# Patient Record
Sex: Female | Born: 1995 | Race: Black or African American | Hispanic: No | Marital: Single | Smoking: Never smoker
Health system: Southern US, Community
[De-identification: ages and names within clinical notes are randomized; demographics above are authoritative.]

## PROBLEM LIST (undated history)

## (undated) ENCOUNTER — Inpatient Hospital Stay (HOSPITAL_COMMUNITY): Payer: Self-pay

## (undated) DIAGNOSIS — F32A Depression, unspecified: Secondary | ICD-10-CM

## (undated) DIAGNOSIS — F329 Major depressive disorder, single episode, unspecified: Secondary | ICD-10-CM

## (undated) DIAGNOSIS — Z3490 Encounter for supervision of normal pregnancy, unspecified, unspecified trimester: Secondary | ICD-10-CM

## (undated) DIAGNOSIS — D649 Anemia, unspecified: Secondary | ICD-10-CM

## (undated) HISTORY — DX: Anemia, unspecified: D64.9

## (undated) HISTORY — PX: NO PAST SURGERIES: SHX2092

## (undated) HISTORY — DX: Encounter for supervision of normal pregnancy, unspecified, unspecified trimester: Z34.90

---
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
## 2016-07-06 ENCOUNTER — Ambulatory Visit (INDEPENDENT_AMBULATORY_CARE_PROVIDER_SITE_OTHER): Payer: Medicaid Other | Admitting: Family Medicine

## 2016-07-06 ENCOUNTER — Other Ambulatory Visit (HOSPITAL_COMMUNITY)
Admission: RE | Admit: 2016-07-06 | Discharge: 2016-07-06 | Disposition: A | Discharge status: Home / Self Care | Payer: Medicaid Other | Source: Ambulatory Visit | Attending: Family Medicine | Admitting: Family Medicine

## 2016-07-06 ENCOUNTER — Telehealth: Payer: Self-pay | Admitting: Family Medicine

## 2016-07-06 ENCOUNTER — Encounter: Payer: Self-pay | Admitting: Family Medicine

## 2016-07-06 VITALS — BP 114/76 | HR 102 | Temp 98.7°F | Ht 63.0 in | Wt 149.2 lb

## 2016-07-06 DIAGNOSIS — R300 Dysuria: Secondary | ICD-10-CM | POA: Insufficient documentation

## 2016-07-06 DIAGNOSIS — Z113 Encounter for screening for infections with a predominantly sexual mode of transmission: Secondary | ICD-10-CM

## 2016-07-06 DIAGNOSIS — N898 Other specified noninflammatory disorders of vagina: Secondary | ICD-10-CM | POA: Insufficient documentation

## 2016-07-06 LAB — POCT URINALYSIS DIPSTICK
Bilirubin, UA: NEGATIVE
Glucose, UA: NEGATIVE
KETONES UA: NEGATIVE
Nitrite, UA: NEGATIVE
SPEC GRAV UA: 1.025
Urobilinogen, UA: 4
pH, UA: 7

## 2016-07-06 LAB — POCT WET PREP (WET MOUNT)
CLUE CELLS WET PREP WHIFF POC: NEGATIVE
TRICHOMONAS WET PREP HPF POC: ABSENT

## 2016-07-06 MED ORDER — METRONIDAZOLE 500 MG PO TABS: 500.0000 mg | ORAL_TABLET | Freq: Two times a day (BID) | ORAL | 0 refills | Status: AC

## 2016-07-06 MED ORDER — CEPHALEXIN 500 MG PO CAPS: 500.0000 mg | ORAL_CAPSULE | Freq: Two times a day (BID) | ORAL | 0 refills | Status: AC

## 2016-07-06 NOTE — Patient Instructions (Signed)
It was great seeing you today! We have addressed the following issues today  1. You were prescribed Keflex antibiotic please take 1 tab in the morning and one tab in the evening for 7 days. If symptoms do not improve or you develop fever, back pain, chills please come back to clinic.   If we did any lab work today, and the results require attention, either me or my nurse will get in touch with you. If everything is normal, you will get a letter in mail. If you don't hear from us in two weeks, please give us a call. Otherwise, I look forward to talking with you again at our next visit. If you have any questions or concerns before then, please call the clinic at [phone].  Please bring all your medications to every doctors visit   Sign up for My Chart to have easy access to your labs results, and communication with your Primary care physician.    Please check-out at the front desk before leaving the clinic.   Take Care,

## 2016-07-06 NOTE — Addendum Note (Signed)
Addended by: Lovena NeighboursIALLO, Pattye Meda F on: 07/06/2016 05:43 PM   Modules accepted: Orders

## 2016-07-06 NOTE — Telephone Encounter (Signed)
Called patient an informed her of her wet prep results and new diagnosis of bacterial vaginosis. Patient was told to pick up her metronidazole prescription at her pharmacy and take two take every day for 7 days

## 2016-07-06 NOTE — Assessment & Plan Note (Addendum)
  Patient with dysuria and increase frequency for the past few days consistent with UTI symptoms. WBC and hematuria noted on urine dipstick. --Wet prep --Urine dipstick and culture --GC/Chlamydia testing --Start patient on Keflex 500mg  BID for 7 days

## 2016-07-06 NOTE — Assessment & Plan Note (Signed)
Patient with vaginal discharge for the past two days. Wet prep show numerous clue cells, positive whiff consistent with bacterial vaginosis. --Start on Metronidazole 500 mg BID for 7 days

## 2016-07-06 NOTE — Progress Notes (Signed)
   Subjective:    Patient ID: Linda Keith, female    DOB: 07/08/1996, [age] y.o.   MRN: [id number]   CC: Dysuria and hematuria  HPI: Patient is 20 yo female with a past medical history significant for chlamydia infection who presented today with dysuria and increase frequency. Patient reports that she has been having burning with urination with increase frequency  For the past week. She also has noticed some vaginal discharge for the past two days. Patient has recently resume sexually activity, but states that they have been using condoms. Patient had Nexplanon placed a few months ago and has been doing well with it. Patient denies fever, chills, nausea, vomiting abdominal pain and back pain.   Smoking status reviewed   ROS: all other systems were reviewed and are negative other than in the HPI   Past medical history, surgical, family, and social history reviewed and updated in the EMR as appropriate.  Objective:  BP 114/76 (BP Location: Left Arm, Patient Position: Sitting, Cuff Size: Normal)   Pulse (!) 102   Temp 98.7 F (37.1 C) (Oral)   Ht 5\' 3"  (1.6 m)   Wt 67.7 kg (149 lb 3.2 oz)   SpO2 100%   BMI 26.43 kg/m   Physical Exam  Constitutional: She is oriented to person, place, and time. She appears well-developed and well-nourished.  HENT:  Head: Normocephalic and atraumatic.  Nose: Nose normal.  Eyes: EOM are normal. Pupils are equal, round, and reactive to light.  Neck: Normal range of motion. Neck supple.  Cardiovascular: Normal rate, regular rhythm and normal heart sounds.   Pulmonary/Chest: Effort normal and breath sounds normal.  Abdominal: Soft. Bowel sounds are normal.  Genitourinary: Vaginal discharge found.  Musculoskeletal: Normal range of motion.  Neurological: She is alert and oriented to person, place, and time.  Skin: Skin is warm and dry.  Psychiatric: She has a normal mood and affect. Her behavior is normal. Judgment and thought content normal.    Assessment & Plan:    Dysuria  Patient with dysuria and increase frequency for the past few days consistent with UTI symptoms. WBC and hematuria noted on urine dipstick. --Wet prep --Urine dipstick and culture --GC/Chlamydia testing --Start patient on Keflex 500mg  BID for 7 days   Vaginal discharge Patient with vaginal discharge for the past two days. Wet prep show numerous clue cells, positive whiff consistent with bacterial vaginosis. --Start on Metronidazole 500 mg BID for 7 days  Lovena NeighboursAbdoulaye Paytin Ramakrishnan, MD Family Medicine Resident PGY-1

## 2016-07-08 ENCOUNTER — Telehealth: Payer: Self-pay | Admitting: Pediatrics

## 2016-07-08 LAB — URINE CULTURE: Organism ID, Bacteria: 10000

## 2016-07-08 LAB — GC/CHLAMYDIA PROBE AMP (~~LOC~~) NOT AT ARMC
Chlamydia: POSITIVE — AB
Neisseria Gonorrhea: POSITIVE — AB

## 2016-07-08 NOTE — Telephone Encounter (Signed)
Results for GC/Chlamydia was positive, called patient to inform her of results. I was unable to talk to patient. Will attempt to talk to her again tomorrow.

## 2016-07-09 ENCOUNTER — Telehealth: Payer: Self-pay | Admitting: *Deleted

## 2016-07-09 MED ORDER — CEFIXIME 400 MG PO TABS: 400.0000 mg | ORAL_TABLET | Freq: Every day | ORAL | 0 refills | Status: DC

## 2016-07-09 MED ORDER — AZITHROMYCIN 1 G PO PACK: 1.0000 g | PACK | Freq: Once | ORAL | 0 refills | Status: AC

## 2016-07-09 NOTE — Telephone Encounter (Signed)
Per Dr. Sydnee Cabaliallo: GC/Chlamydia is positive. I just call her but was not able to reach her. Could you try to call her tomorrow and tell her that her results were positive and that she need to come back to clinic for treatment. Will probably need a IM shot of ceftriaxone. Her partner will also need to be treated.    LM for patient to call back to clinic.  Checked with MD to see if patient needs both oral and injectable treatment.  Sami Roes,CMA

## 2016-07-09 NOTE — Addendum Note (Signed)
Addended by: Lovena NeighboursIALLO, Hafsa Lohn F on: 07/09/2016 02:09 PM   Modules accepted: Orders

## 2016-07-09 NOTE — Telephone Encounter (Signed)
Patient is aware of results and would like medication sent to the pharmacy since she is unable to get off of work anymore this week.  She is also aware that her partner will need to be treated.  Will forward to MD to send this and pharmacy was verified with her.

## 2016-07-09 NOTE — Telephone Encounter (Signed)
Per Dr. Sydnee Cabaliallo:  I sent 1 dose of PO cefixime 400 mg and a single dose of azithromycin 1 g PO to her pharmacy she can go get that anytime. She can stop taking the metronidazole, she actually does not have Bacterial Vaginosis (BV) but should finish the keflex course for her UTI.   Patient is aware of this. Linda Keith,CMA

## 2017-04-11 ENCOUNTER — Ambulatory Visit (HOSPITAL_COMMUNITY)
Admission: EM | Admit: 2017-04-11 | Discharge: 2017-04-11 | Disposition: A | Discharge status: Home / Self Care | Payer: Medicaid Other | Attending: Internal Medicine | Admitting: Internal Medicine

## 2017-04-11 ENCOUNTER — Encounter (HOSPITAL_COMMUNITY): Payer: Self-pay | Admitting: *Deleted

## 2017-04-11 DIAGNOSIS — S161XXA Strain of muscle, fascia and tendon at neck level, initial encounter: Secondary | ICD-10-CM | POA: Diagnosis not present

## 2017-04-11 DIAGNOSIS — S39012A Strain of muscle, fascia and tendon of lower back, initial encounter: Secondary | ICD-10-CM

## 2017-04-11 MED ORDER — NAPROXEN 500 MG PO TABS: 500.0000 mg | ORAL_TABLET | Freq: Two times a day (BID) | ORAL | 0 refills | Status: DC

## 2017-04-11 MED ORDER — CYCLOBENZAPRINE HCL 10 MG PO TABS: 10.0000 mg | ORAL_TABLET | Freq: Two times a day (BID) | ORAL | 0 refills | Status: DC | PRN
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
## 2017-07-06 ENCOUNTER — Ambulatory Visit: Payer: Self-pay | Admitting: Family Medicine

## 2017-08-24 ENCOUNTER — Encounter: Payer: Medicaid Other | Admitting: Family Medicine

## 2017-09-29 ENCOUNTER — Encounter: Payer: Self-pay | Admitting: Family Medicine

## 2017-09-29 ENCOUNTER — Other Ambulatory Visit (HOSPITAL_COMMUNITY)
Admission: RE | Admit: 2017-09-29 | Discharge: 2017-09-29 | Disposition: A | Discharge status: Home / Self Care | Payer: Medicaid Other | Source: Ambulatory Visit | Attending: Family Medicine | Admitting: Family Medicine

## 2017-09-29 ENCOUNTER — Ambulatory Visit (INDEPENDENT_AMBULATORY_CARE_PROVIDER_SITE_OTHER): Payer: Medicaid Other | Admitting: Family Medicine

## 2017-09-29 ENCOUNTER — Encounter (INDEPENDENT_AMBULATORY_CARE_PROVIDER_SITE_OTHER): Payer: Self-pay

## 2017-09-29 ENCOUNTER — Other Ambulatory Visit: Payer: Self-pay

## 2017-09-29 VITALS — BP 100/64 | HR 93 | Temp 98.6°F | Wt 164.0 lb

## 2017-09-29 DIAGNOSIS — I422 Other hypertrophic cardiomyopathy: Secondary | ICD-10-CM | POA: Diagnosis not present

## 2017-09-29 DIAGNOSIS — Z113 Encounter for screening for infections with a predominantly sexual mode of transmission: Secondary | ICD-10-CM | POA: Insufficient documentation

## 2017-09-29 DIAGNOSIS — Z0000 Encounter for general adult medical examination without abnormal findings: Secondary | ICD-10-CM | POA: Insufficient documentation

## 2017-09-29 NOTE — Progress Notes (Signed)
   Subjective:    Patient ID: Linda Keith, female    DOB: 17-Apr-1996, 21 y.o.   MRN: [id number]   CC: Annual Physical   HPI: Patient is a 21 yo female with no significant past medical history who present today for her annual physical. Patient has no acute complaints at today visit. Patient report mother was recently diagnosed with hypertrophic cardiomyopathy and was told she needed to be screened. Patient denies any chest pain, shortness of breath, dizziness, poor exercise tolerance.   Smoking status reviewed   ROS: all other systems were reviewed and are negative other than in the HPI   History reviewed. No pertinent past medical history.  Past Surgical History:  Procedure Laterality Date  . CESAREAN SECTION N/A 11/14/2014   Procedure: CESAREAN SECTION;  Surgeon: Catalina AntiguaPeggy Constant, MD;  Location: [facility];  Service: Obstetrics;  Laterality: N/A;  . NO PAST SURGERIES      Past medical history, surgical, family, and social history reviewed and updated in the EMR as appropriate.  Objective:  BP 100/64   Pulse 93   Temp 98.6 F (37 C) (Oral)   Wt 164 lb (74.4 kg)   SpO2 99%   BMI 29.05 kg/m   Vitals and nursing note reviewed  General: NAD, pleasant, able to participate in exam Cardiac: RRR, normal heart sounds, no murmurs. 2+ radial and PT pulses bilaterally Respiratory: CTAB, normal effort, No wheezes, rales or rhonchi Abdomen: soft, nontender, nondistended, no hepatic or splenomegaly, +BS Extremities: no edema or cyanosis. WWP. Skin: warm and dry, no rashes noted Neuro: alert and oriented x4, no focal deficits Psych: Normal affect and mood   Assessment & Plan:   #Annual physical Patient physical exam was within normal limits. Patient will be referred to cardiology for HCM screening given first relative diagnosis (mother). Will screen for hyperlipidemia and will also order CBC and BMP. Patient will also have STD screening today.   Linda NeighboursAbdoulaye Lainie Daubert, MD [facility]  Family Medicine PGY-2

## 2017-09-29 NOTE — Patient Instructions (Signed)
It was great seeing you today! We have addressed the following issues today  1. I will refer you to cardiology for follow up on cardiomyopathy 2. I ordered a lipid panel, a CBC and a BMP. Will follow up with you on the lab results if they are abnormal.  If we did any lab work today, and the results require attention, either me or my nurse will get in touch with you. If everything is normal, you will get a letter in mail and a message via . If you don't hear from us in two weeks, please give us a call. Otherwise, we look forward to seeing you again at your next visit. If you have any questions or concerns before then, please call the clinic at [phone].  Please bring all your medications to every doctors visit  Sign up for My Chart to have easy access to your labs results, and communication with your Primary care physician. Please ask Front Desk for some assistance.   Please check-out at the front desk before leaving the clinic.    Take Care,   Dr. Sydnee Cabaliallo

## 2017-09-30 ENCOUNTER — Telehealth: Payer: Self-pay

## 2017-09-30 ENCOUNTER — Encounter: Payer: Self-pay | Admitting: Family Medicine

## 2017-09-30 ENCOUNTER — Other Ambulatory Visit: Payer: Self-pay | Admitting: Family Medicine

## 2017-09-30 LAB — CBC WITH DIFFERENTIAL/PLATELET
BASOS ABS: 0 10*3/uL (ref 0.0–0.2)
Basos: 0 %
EOS (ABSOLUTE): 0.1 10*3/uL (ref 0.0–0.4)
Eos: 1 %
HEMOGLOBIN: 11.9 g/dL (ref 11.1–15.9)
Hematocrit: 38.8 % (ref 34.0–46.6)
IMMATURE GRANS (ABS): 0 10*3/uL (ref 0.0–0.1)
IMMATURE GRANULOCYTES: 0 %
Lymphocytes Absolute: 1.1 10*3/uL (ref 0.7–3.1)
Lymphs: 20 %
MCH: 22.5 pg — ABNORMAL LOW (ref 26.6–33.0)
MCHC: 30.7 g/dL — ABNORMAL LOW (ref 31.5–35.7)
MCV: 73 fL — ABNORMAL LOW (ref 79–97)
Monocytes Absolute: 0.4 10*3/uL (ref 0.1–0.9)
Monocytes: 8 %
NEUTROS ABS: 3.6 10*3/uL (ref 1.4–7.0)
Neutrophils: 71 %
PLATELETS: 267 10*3/uL (ref 150–379)
RBC: 5.29 x10E6/uL — ABNORMAL HIGH (ref 3.77–5.28)
RDW: 15.4 % (ref 12.3–15.4)
WBC: 5.2 10*3/uL (ref 3.4–10.8)

## 2017-09-30 LAB — BASIC METABOLIC PANEL
BUN / CREAT RATIO: 16 (ref 9–23)
BUN: 13 mg/dL (ref 6–20)
CO2: 20 mmol/L (ref 20–29)
CREATININE: 0.81 mg/dL (ref 0.57–1.00)
Calcium: 9.3 mg/dL (ref 8.7–10.2)
Chloride: 105 mmol/L (ref 96–106)
GFR calc Af Amer: 121 mL/min/{1.73_m2} (ref >59)
GFR calc non Af Amer: 105 mL/min/{1.73_m2} (ref >59)
GLUCOSE: 89 mg/dL (ref 65–99)
POTASSIUM: 4.6 mmol/L (ref 3.5–5.2)
SODIUM: 141 mmol/L (ref 134–144)

## 2017-09-30 LAB — LIPID PANEL
Chol/HDL Ratio: 3.5 ratio (ref 0.0–4.4)
Cholesterol, Total: 152 mg/dL (ref 100–199)
HDL: 43 mg/dL (ref >39)
LDL CALC: 97 mg/dL (ref 0–99)
Triglycerides: 60 mg/dL (ref 0–149)
VLDL CHOLESTEROL CAL: 12 mg/dL (ref 5–40)

## 2017-09-30 LAB — URINE CYTOLOGY ANCILLARY ONLY
Chlamydia: POSITIVE — AB
Neisseria Gonorrhea: NEGATIVE
TRICH (WINDOWPATH): NEGATIVE

## 2017-09-30 MED ORDER — AZITHROMYCIN 250 MG PO TABS: 1000.0000 mg | ORAL_TABLET | Freq: Once | ORAL | 0 refills | Status: AC

## 2017-09-30 NOTE — Telephone Encounter (Signed)
Pt tested positive for Chlamydia, 1gram of Zithromax has been sent to her pharmacy, per pcp. Pt has been contacted and informed of above. Safe sex briefly discussed, no sex for 7 days, please inform partner. Call back number given if any further questions.

## 2017-11-02 ENCOUNTER — Encounter: Payer: Self-pay | Admitting: Family Medicine

## 2017-11-23 ENCOUNTER — Ambulatory Visit: Payer: Medicaid Other | Admitting: Cardiology

## 2017-12-21 ENCOUNTER — Encounter: Payer: Self-pay | Admitting: Family Medicine

## 2017-12-24 ENCOUNTER — Other Ambulatory Visit: Payer: Self-pay

## 2017-12-24 ENCOUNTER — Other Ambulatory Visit (HOSPITAL_COMMUNITY)
Admission: RE | Admit: 2017-12-24 | Discharge: 2017-12-24 | Disposition: A | Discharge status: Home / Self Care | Payer: Medicaid Other | Source: Ambulatory Visit | Attending: Family Medicine | Admitting: Family Medicine

## 2017-12-24 ENCOUNTER — Ambulatory Visit (INDEPENDENT_AMBULATORY_CARE_PROVIDER_SITE_OTHER): Payer: Medicaid Other | Admitting: Internal Medicine

## 2017-12-24 ENCOUNTER — Encounter: Payer: Self-pay | Admitting: Internal Medicine

## 2017-12-24 VITALS — BP 100/70 | HR 86 | Temp 99.4°F | Wt 164.0 lb

## 2017-12-24 DIAGNOSIS — G8929 Other chronic pain: Secondary | ICD-10-CM

## 2017-12-24 DIAGNOSIS — Z8619 Personal history of other infectious and parasitic diseases: Secondary | ICD-10-CM | POA: Insufficient documentation

## 2017-12-24 DIAGNOSIS — M25562 Pain in left knee: Secondary | ICD-10-CM

## 2017-12-24 LAB — POCT URINE PREGNANCY: Preg Test, Ur: NEGATIVE

## 2017-12-24 NOTE — Assessment & Plan Note (Signed)
Tested positive for chlamydia in 09/2017. Wants to have TOC done. - Urine chlamydia ordered, as patient declined pelvic exam - Urine pregnancy test ordered per patient request - Follow-up next month for Nexplanon removal. Patient interested in OCPs.

## 2017-12-24 NOTE — Patient Instructions (Signed)
It was so nice to see you!  I have tested your urine and will call you with your results.  For your knee pain- I think you have something called patellofemoral pain syndrome. Please do the exercises I have given you once a day for 4 weeks. You can also wear a compression sleeve at work and take Ibuprofen as needed.  -Dr. Nancy MarusMayo

## 2017-12-24 NOTE — Assessment & Plan Note (Signed)
Likely patellofemoral pain syndrome with some IT band tightness. No ligamentous instability. No signs of septic arthritis. - Treat conservatively with home rehabilitation exercises- handout given - Recommend wearing compression sleeve while at work - Ice and Ibuprofen as needed - Follow-up with PCP in 4 weeks if no improvement.

## 2017-12-24 NOTE — Progress Notes (Signed)
   [facility] Phone: [phone]  Subjective:  Linda Keith is a 22 year old female presenting to clinic for STD testing and left knee pain.  STD testing: Tested positive for chlamydia 09/29/17. Was treated with Azithromycin PO x 1. She was sexually active with one female partner at the time. That relationship has ended, so she wants to make sure that she does not still have chlamydia. She states that her previous female partner was supposed to have been treated with chlamydia, but she has no way of knowing if this ever actually happened. They were not using condoms. She currently has a nexplanon in place for birth control.  Left Knee Pain: Has been going on for the last year. Pain is located in the anterior knee. She also notes swelling, but denies redness or warmth. The pain and swelling are worse when she is on her feet a lot at work. The pain is better with rest. The pain feels like a "throbbing". She denies any popping, clicking, or locking. She has not tried taking anything for the pain. No trauma or injury to the knee.  ROS: See HPI for pertinent positives and negatives  Past Medical History- recent hx of chlamydia  Family history reviewed for today's visit. No changes.  Social history- patient is a never smoker  Objective: BP 100/70   Pulse 86   Temp 99.4 F (37.4 C) (Oral)   Wt 164 lb (74.4 kg)   SpO2 99%   BMI 29.05 kg/m  Gen: NAD, alert, cooperative with exam Left Knee: No erythema, edema, or gross deformity. Normal ROM. No crepitus. +mild tenderness to palpation over the distal portion of the IT band. +patellar apprehension. -anterior drawer test, -McMurray's, valgus and varus stress tests are negative.  Assessment/Plan: Hx of STD: Tested positive for chlamydia in 09/2017. Wants to have TOC done. - Urine chlamydia ordered, as patient declined pelvic exam - Urine pregnancy test ordered per patient request - Follow-up next month for Nexplanon removal.  Patient interested in OCPs.  Left Knee Pain: Likely patellofemoral pain syndrome with some IT band tightness. No ligamentous instability. No signs of septic arthritis. - Treat conservatively with home rehabilitation exercises- handout given - Recommend wearing compression sleeve while at work - Ice and Ibuprofen as needed - Follow-up with PCP in 4 weeks if no improvement.   Willadean CarolKaty Alroy Portela, MD PGY-3

## 2017-12-27 LAB — URINE CYTOLOGY ANCILLARY ONLY
Chlamydia: NEGATIVE
NEISSERIA GONORRHEA: NEGATIVE
TRICH (WINDOWPATH): NEGATIVE

## 2017-12-29 ENCOUNTER — Telehealth: Payer: Self-pay | Admitting: Internal Medicine

## 2017-12-29 NOTE — Telephone Encounter (Signed)
Called patient to let her know that her STD testing was negative. She will call us back with any additional questions.  Willadean CarolKaty Zander Ingham, MD PGY-3

## 2017-12-30 ENCOUNTER — Encounter: Payer: Self-pay | Admitting: Internal Medicine

## 2018-01-03 ENCOUNTER — Ambulatory Visit (INDEPENDENT_AMBULATORY_CARE_PROVIDER_SITE_OTHER): Payer: Medicaid Other | Admitting: Cardiology

## 2018-01-03 ENCOUNTER — Encounter: Payer: Self-pay | Admitting: Cardiology

## 2018-01-03 VITALS — BP 120/76 | HR 85 | Ht 64.0 in | Wt 168.8 lb

## 2018-01-03 DIAGNOSIS — R0789 Other chest pain: Secondary | ICD-10-CM

## 2018-01-03 DIAGNOSIS — Z8249 Family history of ischemic heart disease and other diseases of the circulatory system: Secondary | ICD-10-CM | POA: Diagnosis not present

## 2018-01-03 NOTE — Progress Notes (Signed)
Cardiology Office Note:    Date:  01/03/2018   ID:  Linda Keith, DOB 02-Aug-1996, MRN [id number]  PCP:  Lovena Neighbours, MD  Cardiologist:  No primary care provider on file.   Referring MD: Lovena Neighbours, MD     History of Present Illness:    Linda Keith is a 22 y.o. 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  Her mother was diagnosed with hypertrophic cardiomyopathy after suffering sudden cardiac death at their house.  She was out for about 15 minutes.  Thankfully her uncle was available and called EMS.  They were able to cardiovert her.  No evidence of heart attack.  She overall has been feeling quite well.  Occasionally she will feel some atypical chest discomfort after eating [employer] which is likely GERD related.  She is not had any unexplained syncope.  No palpitations.  No significant shortness of breath.  She works in Warden/ranger at [employer].  Mother diasgnosed 8. Would not wake up, mouth twisted. EMS, no pulse, CPR shocks. 15 min. No MI in the hospital. Uncle was there.   Grandmother has pacer.   History reviewed. No pertinent past medical history.  Past Surgical History:  Procedure Laterality Date  . CESAREAN SECTION N/A 11/14/2014   Procedure: CESAREAN SECTION;  Surgeon: Catalina Antigua, MD;  Location: [facility];  Service: Obstetrics;  Laterality: N/A;  . NO PAST SURGERIES      Current Medications: No outpatient medications have been marked as taking for the 01/03/18 encounter (Office Visit) with Jake Bathe, MD.     Allergies:   Patient has no known allergies.   Social History   Socioeconomic History  . Marital status: Single    Spouse name: None  . Number of children: None  . Years of education: None  . Highest education level: None  Social Needs  . Financial resource strain: None  . Food insecurity - worry: None  . Food insecurity - inability: None  . Transportation needs - medical: None  .  Transportation needs - non-medical: None  Occupational History  . None  Tobacco Use  . Smoking status: Never Smoker  . Smokeless tobacco: Never Used  Substance and Sexual Activity  . Alcohol use: No    Alcohol/week: 0.0 oz  . Drug use: No  . Sexual activity: Yes    Birth control/protection: None  Other Topics Concern  . None  Social History Narrative  . None     Family History: The patient's has a family history of hypertrophic cardiomyopathy  ROS:   Please see the history of present illness.     All other systems reviewed and are negative.  EKGs/Labs/Other Studies Reviewed:    The following studies were reviewed today: Prior office notes reviewed.  Lab work reviewed, hemoglobin 11.9, LDL 97, creatinine 0.8  EKG:  EKG is  ordered today.  The ekg ordered today demonstrates 01/03/18-sinus rhythm 88 with no other abnormalities.  Normal intervals.  Personally viewed.  Recent Labs: 09/29/2017: BUN 13; Creatinine, Ser 0.81; Hemoglobin 11.9; Platelets 267; Potassium 4.6; Sodium 141  Recent Lipid Panel    Component Value Date/Time   CHOL 152 09/29/2017 1442   TRIG 60 09/29/2017 1442   HDL 43 09/29/2017 1442   CHOLHDL 3.5 09/29/2017 1442   LDLCALC 97 09/29/2017 1442    Physical Exam:    VS:  BP 120/76   Pulse 85   Ht 5\' 4"  (1.626 m)   Wt 168 lb 12.8  oz (76.6 kg)   BMI 28.97 kg/m     Wt Readings from Last 3 Encounters:  01/03/18 168 lb 12.8 oz (76.6 kg)  12/24/17 164 lb (74.4 kg)  09/29/17 164 lb (74.4 kg)     GEN:  Well nourished, well developed in no acute distress HEENT: Normal NECK: No JVD; No carotid bruits LYMPHATICS: No lymphadenopathy CARDIAC: RRR, no murmurs, rubs, gallops RESPIRATORY:  Clear to auscultation without rales, wheezing or rhonchi  ABDOMEN: Soft, non-tender, non-distended MUSCULOSKELETAL:  No edema; No deformity  SKIN: Warm and dry NEUROLOGIC:  Alert and oriented x 3 PSYCHIATRIC:  Normal affect   ASSESSMENT:    1. Atypical chest pain     2. Family history of hypertrophic cardiomyopathy    PLAN:    In order of problems listed above:  Family history of hypertrophic cardiomyopathy, atypical chest pain  -I will check an echocardiogram to ensure proper structure and function of her heart.  See if she carries any phenotypic evidence of hypertrophic cardiomyopathy.  We will also check an exercise treadmill test to make sure she does not have any dangerous or adverse arrhythmias.  Obviously if symptoms worsen or become more severe, she will let us know.  Medication Adjustments/Labs and Tests Ordered: Current medicines are reviewed at length with the patient today.  Concerns regarding medicines are outlined above.  Orders Placed This Encounter  Procedures  . Exercise Tolerance Test  . EKG 12-Lead  . ECHOCARDIOGRAM COMPLETE   No orders of the defined types were placed in this encounter.   Signed, Donato SchultzMark Skains, MD  01/03/2018 11:31 AM     [facility]

## 2018-01-03 NOTE — Patient Instructions (Signed)
Medication Instructions:  The current medical regimen is effective;  continue present plan and medications.  Testing/Procedures: Your physician has requested that you have an echocardiogram. Echocardiography is a painless test that uses sound waves to create images of your heart. It provides your doctor with information about the size and shape of your heart and how well your heart's chambers and valves are working. This procedure takes approximately one hour. There are no restrictions for this procedure.  Your physician has requested that you have an exercise tolerance test. For further information please visit www.cardiosmart.org. Please also follow instruction sheet, as given.  Follow-Up: Follow up as needed after the above testing.   Thank you for choosing [facility]!!     

## 2018-01-05 ENCOUNTER — Ambulatory Visit: Payer: Medicaid Other | Admitting: Family Medicine

## 2018-01-05 ENCOUNTER — Encounter: Payer: Self-pay | Admitting: Family Medicine

## 2018-01-05 ENCOUNTER — Other Ambulatory Visit: Payer: Self-pay

## 2018-01-05 VITALS — BP 104/71 | HR 81 | Temp 98.7°F | Ht 63.0 in | Wt 169.0 lb

## 2018-01-05 DIAGNOSIS — Z3046 Encounter for surveillance of implantable subdermal contraceptive: Secondary | ICD-10-CM

## 2018-01-05 DIAGNOSIS — Z3049 Encounter for surveillance of other contraceptives: Secondary | ICD-10-CM | POA: Diagnosis present

## 2018-01-05 NOTE — Progress Notes (Signed)
   Subjective:    Patient ID: Linda Keith, female    DOB: [birth year], 22 y.o.   MRN: [id number]   CC: Nexplanon removal  HPI: Patient is a 22 yo female with no significant past medical history who present today for Nexplanon removal. Patient denies any problems with it. Patient had it for three years and was due for removal. Patient is interested on using oral contraceptives and will make an appointment to discuss other options.  Smoking status reviewed   ROS: all other systems were reviewed and are negative other than in the HPI   History reviewed. No pertinent past medical history.  Past Surgical History:  Procedure Laterality Date  . CESAREAN SECTION N/A 11/14/2014   Procedure: CESAREAN SECTION;  Surgeon: Catalina AntiguaPeggy Constant, MD;  Location: [facility];  Service: Obstetrics;  Laterality: N/A;  . NO PAST SURGERIES      Past medical history, surgical, family, and social history reviewed and updated in the EMR as appropriate.  Objective:  BP 104/71   Pulse 81   Temp 98.7 F (37.1 C) (Oral)   Ht 5\' 3"  (1.6 m)   Wt 169 lb (76.7 kg)   SpO2 99%   BMI 29.94 kg/m   Vitals and nursing note reviewed  General: NAD, pleasant, able to participate in exam Cardiac: RRR, normal heart sounds, no murmurs. 2+ radial and PT pulses bilaterally Respiratory: CTAB, normal effort, No wheezes, rales or rhonchi Abdomen: soft, nontender, nondistended, no hepatic or splenomegaly, +BS Extremities: no edema or cyanosis. WWP. Skin: warm and dry, no rashes noted Neuro: alert and oriented x4, no focal deficits Psych: Normal affect and mood   Assessment & Plan:   Nexplanon removal Patient given informed consent for removal of her Implanon, time out was performed.  Signed copy in the chart.  Appropriate time out taken. Implanon site identified.  Area prepped in usual sterile fashon. 5 cc of 1% lidocaine was used to anesthetize the area at the distal end of the implant. A small stab incision was made right  beside the implant on the distal portion.  The implanon rod was grasped using hemostats and removed without difficulty.  There was less than 3 cc blood loss. There were no complications.  A small amount of antibiotic ointment and steri-strips were applied over the small incision.  A pressure bandage was applied to reduce any bruising.  The patient tolerated the procedure well and was given post procedure instructions.  Patient will follow up to discuss alternative method of contraception.Currently interested in OCPs.    Lovena NeighboursAbdoulaye Amram Maya, MD [facility] PGY-2

## 2018-01-05 NOTE — Patient Instructions (Signed)
Nexplanon Instructions After Insertion  Keep bandage clean and dry for 24 hours  May use ice/Tylenol/Ibuprofen for soreness or pain  If you develop fever, drainage or increased warmth from incision site-contact office immediately   

## 2018-01-13 ENCOUNTER — Other Ambulatory Visit (HOSPITAL_COMMUNITY): Payer: Medicaid Other

## 2018-01-14 ENCOUNTER — Other Ambulatory Visit (HOSPITAL_COMMUNITY): Payer: Medicaid Other

## 2018-01-19 ENCOUNTER — Encounter: Payer: Self-pay | Admitting: Family Medicine

## 2018-01-25 ENCOUNTER — Encounter: Payer: Self-pay | Admitting: Family Medicine

## 2018-01-25 ENCOUNTER — Other Ambulatory Visit: Payer: Self-pay

## 2018-01-25 ENCOUNTER — Ambulatory Visit (INDEPENDENT_AMBULATORY_CARE_PROVIDER_SITE_OTHER): Payer: Medicaid Other | Admitting: Family Medicine

## 2018-01-25 VITALS — BP 100/60 | HR 90 | Temp 97.9°F | Wt 171.0 lb

## 2018-01-25 DIAGNOSIS — Z7251 High risk heterosexual behavior: Secondary | ICD-10-CM

## 2018-01-25 DIAGNOSIS — Z30011 Encounter for initial prescription of contraceptive pills: Secondary | ICD-10-CM | POA: Diagnosis not present

## 2018-01-25 LAB — POCT URINE PREGNANCY: Preg Test, Ur: NEGATIVE

## 2018-01-25 MED ORDER — NORGESTIMATE-ETH ESTRADIOL 0.25-35 MG-MCG PO TABS: 1.0000 | ORAL_TABLET | Freq: Every day | ORAL | 11 refills | Status: DC

## 2018-01-25 NOTE — Assessment & Plan Note (Signed)
Recent  Unprotected sexual intercourse. Patient took plan B the next day. Urine pregnancy test was negative today. Will start OCPs. Patient was not intested in STD check.

## 2018-01-25 NOTE — Patient Instructions (Signed)
Oral Contraception Information Oral contraceptive pills (OCPs) are medicines taken to prevent pregnancy. OCPs work by preventing the ovaries from releasing eggs. The hormones in OCPs also cause the cervical mucus to thicken, preventing the sperm from entering the uterus. The hormones also cause the uterine lining to become thin, not allowing a fertilized egg to attach to the inside of the uterus. OCPs are highly effective when taken exactly as prescribed. However, OCPs do not prevent sexually transmitted diseases (STDs). Safe sex practices, such as using condoms along with the pill, can help prevent STDs. Before taking the pill, you may have a physical exam and Pap test. Your health care provider may order blood tests. The health care provider will make sure you are a good candidate for oral contraception. Discuss with your health care provider the possible side effects of the OCP you may be prescribed. When starting an OCP, it can take 2 to 3 months for the body to adjust to the changes in hormone levels in your body. Types of oral contraception  The combination pill-This pill contains estrogen and progestin (synthetic progesterone) hormones. The combination pill comes in 21-day, 28-day, or 91-day packs. Some types of combination pills are meant to be taken continuously (365-day pills). With 21-day packs, you do not take pills for 7 days after the last pill. With 28-day packs, the pill is taken every day. The last 7 pills are without hormones. Certain types of pills have more than 21 hormone-containing pills. With 91-day packs, the first 84 pills contain both hormones, and the last 7 pills contain no hormones or contain estrogen only.  The minipill-This pill contains the progesterone hormone only. The pill is taken every day continuously. It is very important to take the pill at the same time each day. The minipill comes in packs of 28 pills. All 28 pills contain the hormone. Advantages of oral  contraceptive pills  Decreases premenstrual symptoms.  Treats menstrual period cramps.  Regulates the menstrual cycle.  Decreases a heavy menstrual flow.  May treatacne, depending on the type of pill.  Treats abnormal uterine bleeding.  Treats polycystic ovarian syndrome.  Treats endometriosis.  Can be used as emergency contraception. Things that can make oral contraceptive pills less effective OCPs can be less effective if:  You forget to take the pill at the same time every day.  You have a stomach or intestinal disease that lessens the absorption of the pill.  You take OCPs with other medicines that make OCPs less effective, such as antibiotics, certain HIV medicines, and some seizure medicines.  You take expired OCPs.  You forget to restart the pill on day 7, when using the packs of 21 pills.  Risks associated with oral contraceptive pills Oral contraceptive pills can sometimes cause side effects, such as:  Headache.  Nausea.  Breast tenderness.  Irregular bleeding or spotting.  Combination pills are also associated with a small increased risk of:  Blood clots.  Heart attack.  Stroke.  This information is not intended to replace advice given to you by your health care provider. Make sure you discuss any questions you have with your health care provider. Document Released: 01/02/2003 Document Revised: 03/19/2016 Document Reviewed: 04/02/2013 Elsevier Interactive Patient Education  2018 Elsevier Inc.  

## 2018-01-25 NOTE — Assessment & Plan Note (Signed)
Patient had a recent nexplanon removal and is planning on another pregnancy next year. Patient is currently only interested in OCPs for the reason cited above. Discuss need to take daily at the same time and importance of adherence. --Prescribe Sprintec with refill  --Follow as needed

## 2018-01-25 NOTE — Progress Notes (Signed)
   Subjective:    Patient ID: Linda Keith, female    DOB: 05/08/1996, 22 y.o.   MRN: [id number]   CC: Discuss contraception   HPI: Patient is a 22 yo female with no significant past medical history who presents today discuss method of contraception. Patient recently had her nexplanon removed. She did not have any issue with it. Since then patient has had unprotected intercourse and has had to use Plan B. Patient is considering having another child next year after her son turns 4. Patient is not interested in any LARC for that reason and would like to try OCPs. Patient has never use OCPs. She was on Depo prior but had bleeding and weight gain. She currently denies any SOB, chest, abdominal pain, vaginal bleeding.  Smoking status reviewed   ROS: all other systems were reviewed and are negative other than in the HPI   History reviewed. No pertinent past medical history.  Past Surgical History:  Procedure Laterality Date  . CESAREAN SECTION N/A 11/14/2014   Procedure: CESAREAN SECTION;  Surgeon: Catalina AntiguaPeggy Constant, MD;  Location: [facility];  Service: Obstetrics;  Laterality: N/A;  . NO PAST SURGERIES      Past medical history, surgical, family, and social history reviewed and updated in the EMR as appropriate.  Objective:  BP 100/60   Pulse 90   Temp 97.9 F (36.6 C) (Oral)   Wt 171 lb (77.6 kg)   LMP 01/07/2018   SpO2 99%   BMI 30.29 kg/m   Vitals and nursing note reviewed  General: NAD, pleasant, able to participate in exam Cardiac: RRR, normal heart sounds, no murmurs. 2+ radial and PT pulses bilaterally Respiratory: CTAB, normal effort, No wheezes, rales or rhonchi Abdomen: soft, nontender, nondistended, no hepatic or splenomegaly, +BS Pelvic exam was not performed Extremities: no edema or cyanosis. WWP. Skin: warm and dry, no rashes noted Neuro: alert and oriented x4, no focal deficits Psych: Normal affect and mood   Assessment & Plan:    Contraception  management Patient had a recent nexplanon removal and is planning on another pregnancy next year. Patient is currently only interested in OCPs for the reason cited above. Discuss need to take daily at the same time and importance of adherence. --Prescribe Sprintec with refill  --Follow as needed  Unprotected sex Recent  Unprotected sexual intercourse. Patient took plan B the next day. Urine pregnancy test was negative today. Will start OCPs. Patient was not intested in STD check.     Lovena NeighboursAbdoulaye Montoya Brandel, MD [facility] PGY-2

## 2018-02-08 ENCOUNTER — Ambulatory Visit (HOSPITAL_COMMUNITY): Payer: Medicaid Other

## 2018-02-08 ENCOUNTER — Ambulatory Visit (INDEPENDENT_AMBULATORY_CARE_PROVIDER_SITE_OTHER): Payer: Medicaid Other

## 2018-02-08 DIAGNOSIS — Z8249 Family history of ischemic heart disease and other diseases of the circulatory system: Secondary | ICD-10-CM

## 2018-02-08 DIAGNOSIS — R0789 Other chest pain: Secondary | ICD-10-CM

## 2018-02-08 LAB — EXERCISE TOLERANCE TEST
CHL CUP MPHR: 200 {beats}/min
CHL CUP RESTING HR STRESS: 85 {beats}/min
CSEPEDS: 13 s
CSEPPHR: 171 {beats}/min
Estimated workload: 8.8 METS
Exercise duration (min): 7 min
Percent HR: 85 %
RPE: 17

## 2018-02-22 ENCOUNTER — Other Ambulatory Visit (HOSPITAL_COMMUNITY): Payer: Medicaid Other

## 2018-03-09 ENCOUNTER — Encounter: Payer: Self-pay | Admitting: Internal Medicine

## 2018-03-09 ENCOUNTER — Encounter: Payer: Self-pay | Admitting: Cardiology

## 2018-03-11 ENCOUNTER — Ambulatory Visit (INDEPENDENT_AMBULATORY_CARE_PROVIDER_SITE_OTHER): Payer: Medicaid Other | Admitting: Family Medicine

## 2018-03-11 ENCOUNTER — Ambulatory Visit: Payer: Medicaid Other | Admitting: Family Medicine

## 2018-03-11 ENCOUNTER — Encounter: Payer: Self-pay | Admitting: Family Medicine

## 2018-03-11 ENCOUNTER — Ambulatory Visit: Payer: Medicaid Other | Admitting: Internal Medicine

## 2018-03-11 ENCOUNTER — Other Ambulatory Visit: Payer: Self-pay

## 2018-03-11 VITALS — BP 110/80 | HR 97 | Temp 98.5°F | Ht 63.0 in | Wt 162.0 lb

## 2018-03-11 DIAGNOSIS — Z7251 High risk heterosexual behavior: Secondary | ICD-10-CM | POA: Diagnosis not present

## 2018-03-11 DIAGNOSIS — R5383 Other fatigue: Secondary | ICD-10-CM

## 2018-03-11 LAB — POCT URINE PREGNANCY: PREG TEST UR: NEGATIVE

## 2018-03-11 NOTE — Patient Instructions (Signed)
It was great to meet you today! Thank you for letting me participate in your care!  Today, we discussed pregnancy test and it was negative. Please return on Monday for a lab appointment for further testing.  Please exercise 30 minutes per day at least 5 times per week. Also, be sure you are getting enough adequate nutrition during the day. Continue to drink plenty of water.  I will call you with the results of your blood work.  Be well, Jules Schick, DO PGY-1, [facility]

## 2018-03-11 NOTE — Progress Notes (Signed)
     Subjective: Chief Complaint  Patient presents with  . Possible Pregnancy     HPI: Linda Keith is a 22 y.o. presenting to clinic today to discuss the following:  Concern after unprotected sex Patient presents and desires a pregnancy test and STD testing after having unprotected sex with a new sex partner. She has no symptoms, no fever, no urinary frequency, urinary pain, discharge, or abnormal foul odor.   Denies chest pain, SOB, abdominal pain, nausea, vomiting, diarrhea, or constipation  Health Maintenance: None     ROS noted in HPI.   Past Medical, Surgical, Social, and Family History Reviewed & Updated per EMR.   Pertinent Historical Findings include:   Social History   Tobacco Use  Smoking Status Never Smoker  Smokeless Tobacco Never Used      Objective: BP 110/80   Pulse 97   Temp 98.5 F (36.9 C) (Oral)   Ht  (1.6 m)   Wt 162 lb (73.5 kg)   LMP 01/25/2018   SpO2 97%   BMI 28.70 kg/m  Vitals and nursing notes reviewed  Physical Exam  Constitutional: She is oriented to person, place, and time. She appears well-developed and well-nourished. No distress.  HENT:  Head: Normocephalic and atraumatic.  Eyes: Pupils are equal, round, and reactive to light. Conjunctivae and EOM are normal.  Neck: Normal range of motion.  Cardiovascular: Normal rate, regular rhythm and normal heart sounds.  No murmur heard. Pulmonary/Chest: Effort normal and breath sounds normal.  Abdominal: Soft. Bowel sounds are normal.  Genitourinary:  Genitourinary Comments: GYN exam deferred at request of patient  Neurological: She is alert and oriented to person, place, and time.  Skin: Skin is warm and dry. No rash noted. No erythema.   Results for orders placed or performed in visit on 03/11/18 (from the past 72 hour(s))  POCT urine pregnancy     Status: None   Collection Time: 03/11/18 11:20 AM  Result Value Ref Range   Preg Test, Ur Negative Negative     Assessment/Plan:  Fatigue Patient has history of anemia during pregnancy that required two blood transfusions. She has some fatigue today but no dizziness, light headedness, or syncope. She denies any abnormal bleeding.  Obtaining CBC and Iron panel to see if patient has anemia again.  Unprotected sex Pregnancy test done in office was negative.  Patient will return to give urine STD as she has already given urine once today. She declines GYN exam today.   PATIENT EDUCATION PROVIDED: See AVS    Diagnosis and plan along with any newly prescribed medication(s) were discussed in detail with this patient today. The patient verbalized understanding and agreed with the plan. Patient advised if symptoms worsen return to clinic or ER.   Health Maintainance:   Orders Placed This Encounter  Procedures  . CBC with Differential  . TSH  . Iron, TIBC and Ferritin Panel  . POCT urine pregnancy    No orders of the defined types were placed in this encounter.    Jules Schick, DO 03/11/2018, 11:53 AM PGY-1, [facility]

## 2018-03-14 ENCOUNTER — Other Ambulatory Visit: Payer: Medicaid Other

## 2018-03-14 ENCOUNTER — Other Ambulatory Visit (HOSPITAL_COMMUNITY)
Admission: RE | Admit: 2018-03-14 | Discharge: 2018-03-14 | Disposition: A | Discharge status: Home / Self Care | Payer: Medicaid Other | Source: Ambulatory Visit | Attending: Family Medicine | Admitting: Family Medicine

## 2018-03-14 DIAGNOSIS — Z7251 High risk heterosexual behavior: Secondary | ICD-10-CM | POA: Diagnosis not present

## 2018-03-15 LAB — CBC WITH DIFFERENTIAL/PLATELET
BASOS ABS: 0 10*3/uL (ref 0.0–0.2)
Basos: 0 %
EOS (ABSOLUTE): 0.1 10*3/uL (ref 0.0–0.4)
EOS: 2 %
Hematocrit: 38.8 % (ref 34.0–46.6)
Hemoglobin: 12.1 g/dL (ref 11.1–15.9)
IMMATURE GRANULOCYTES: 0 %
Immature Grans (Abs): 0 10*3/uL (ref 0.0–0.1)
Lymphocytes Absolute: 1.4 10*3/uL (ref 0.7–3.1)
Lymphs: 45 %
MCH: 22.9 pg — ABNORMAL LOW (ref 26.6–33.0)
MCHC: 31.2 g/dL — ABNORMAL LOW (ref 31.5–35.7)
MCV: 73 fL — ABNORMAL LOW (ref 79–97)
MONOS ABS: 0.3 10*3/uL (ref 0.1–0.9)
Monocytes: 9 %
NEUTROS PCT: 44 %
Neutrophils Absolute: 1.3 10*3/uL — ABNORMAL LOW (ref 1.4–7.0)
PLATELETS: 261 10*3/uL (ref 150–450)
RBC: 5.29 x10E6/uL — AB (ref 3.77–5.28)
RDW: 15.2 % (ref 12.3–15.4)
WBC: 3 10*3/uL — AB (ref 3.4–10.8)

## 2018-03-15 LAB — IRON,TIBC AND FERRITIN PANEL
Ferritin: 65 ng/mL (ref 15–150)
IRON SATURATION: 30 % (ref 15–55)
IRON: 92 ug/dL (ref 27–159)
Total Iron Binding Capacity: 302 ug/dL (ref 250–450)
UIBC: 210 ug/dL (ref 131–425)

## 2018-03-15 LAB — URINE CYTOLOGY ANCILLARY ONLY
CHLAMYDIA, DNA PROBE: NEGATIVE
NEISSERIA GONORRHEA: POSITIVE — AB

## 2018-03-15 LAB — TSH: TSH: 0.927 u[IU]/mL (ref 0.450–4.500)

## 2018-03-16 ENCOUNTER — Ambulatory Visit (INDEPENDENT_AMBULATORY_CARE_PROVIDER_SITE_OTHER): Payer: Medicaid Other

## 2018-03-16 ENCOUNTER — Telehealth: Payer: Self-pay | Admitting: Family Medicine

## 2018-03-16 DIAGNOSIS — A549 Gonococcal infection, unspecified: Secondary | ICD-10-CM

## 2018-03-16 MED ORDER — AZITHROMYCIN 500 MG PO TABS
1000.0000 mg | ORAL_TABLET | Freq: Once | ORAL | Status: AC
  Administered 2018-03-16: 1000 mg via ORAL

## 2018-03-16 MED ORDER — CEFTRIAXONE SODIUM 250 MG IJ SOLR
250.0000 mg | Freq: Once | INTRAMUSCULAR | Status: AC
  Administered 2018-03-16: 250 mg via INTRAMUSCULAR

## 2018-03-16 NOTE — Telephone Encounter (Signed)
Pt called requesting her results from her recent visits. Please call pt back to discuss this.

## 2018-03-16 NOTE — Progress Notes (Signed)
Patient in nurse clinic today for STD treatment of ghonorrhea. Patient advised to abstain from sex for 7-10 days after treatment or when partner has been tested/treated.  Azithromycin 1 GM PO x 1 given and Ceftriaxone 250 mg IM x 1 given in LUOQ per Dr. Starr Sinclair orders.  Patient to follow up in 2-3 months for re-screening.  Advised to use condoms with all sexual activity. Condoms given. STD report form fax completed and faxed to [facility] Department at [phone] (STD department).  Patient verbalized understanding.  Shawna Orleans, RN

## 2018-03-16 NOTE — Telephone Encounter (Signed)
Patient informed of abnormal results and scheduled for a nurse visit today for treatment.  Linda Keith,CMA

## 2018-03-18 DIAGNOSIS — R5383 Other fatigue: Secondary | ICD-10-CM | POA: Insufficient documentation

## 2018-03-18 NOTE — Assessment & Plan Note (Signed)
Pregnancy test done in office was negative.  Patient will return to give urine STD as she has already given urine once today. She declines GYN exam today.

## 2018-03-18 NOTE — Assessment & Plan Note (Signed)
Patient has history of anemia during pregnancy that required two blood transfusions. She has some fatigue today but no dizziness, light headedness, or syncope. She denies any abnormal bleeding.  Obtaining CBC and Iron panel to see if patient has anemia again.

## 2018-03-21 ENCOUNTER — Encounter: Payer: Self-pay | Admitting: Family Medicine

## 2018-06-06 ENCOUNTER — Encounter: Payer: Self-pay | Admitting: Family Medicine

## 2018-06-07 ENCOUNTER — Ambulatory Visit (INDEPENDENT_AMBULATORY_CARE_PROVIDER_SITE_OTHER): Payer: Medicaid Other | Admitting: Family Medicine

## 2018-06-07 ENCOUNTER — Other Ambulatory Visit (HOSPITAL_COMMUNITY)
Admission: RE | Admit: 2018-06-07 | Discharge: 2018-06-07 | Disposition: A | Discharge status: Home / Self Care | Payer: Medicaid Other | Source: Ambulatory Visit | Attending: Family Medicine | Admitting: Family Medicine

## 2018-06-07 ENCOUNTER — Ambulatory Visit: Payer: Medicaid Other | Admitting: Family Medicine

## 2018-06-07 ENCOUNTER — Other Ambulatory Visit: Payer: Self-pay

## 2018-06-07 VITALS — BP 100/58 | HR 101 | Temp 98.2°F | Ht 63.0 in | Wt 158.8 lb

## 2018-06-07 DIAGNOSIS — Z113 Encounter for screening for infections with a predominantly sexual mode of transmission: Secondary | ICD-10-CM | POA: Diagnosis present

## 2018-06-07 DIAGNOSIS — Z3200 Encounter for pregnancy test, result unknown: Secondary | ICD-10-CM | POA: Diagnosis not present

## 2018-06-07 DIAGNOSIS — Z3009 Encounter for other general counseling and advice on contraception: Secondary | ICD-10-CM

## 2018-06-07 LAB — POCT URINE PREGNANCY: Preg Test, Ur: POSITIVE — AB

## 2018-06-07 NOTE — Assessment & Plan Note (Addendum)
Pregnancy test in clinic is positive.  Patient is confident in her decision to terminate the pregnancy.  She has already made an appointment and has time off from work.  Patient was counseled that termination can be a difficult emotional experience and that she should come to the clinic if she is concerned about her mental health.

## 2018-06-07 NOTE — Progress Notes (Signed)
Subjective:    Linda Keith - 22 y.o. female MRN [id number]  Date of birth: 05-24-1996  HPI  Linda Keith is here for a positive pregnancy test and counseling regarding pregnancy termination options.    Positive pregnancy test When she had sex 2 weeks ago, she took the Plan B the day afterward.  She then took a pregnancy test 1 week ago, which was negative.  She took a pregnancy test yesterday when she was 5 days late for her menstrual cycle, and the test was positive. She sent a message to Dr. Sydnee Cabaliallo telling him that she did not want to continue the pregnancy, and he gave her information regarding pregnancy termination options.  She has since contacted the women's center, who had given her price options for both the pill in the surgery, and she has an appointment with them scheduled for August 22.  She is also taken off of work for Thursday and Friday as well as a weekend following her appointment in case she uses the pill for termination.  She says that she already has a 21-year-old child, and she is not prepared for another child.  STI testing Would like to be tested for gonorrhea and chlamydia today.  Declines HIV and RPR testing.  Contraception management Patient is interested in birth control.  She was on the Nexplanon until it expired in March 2019.  After Nexplanon was removed, she was prescribed birth control pills, but she only took them for 1 month because it was hard for her to remember to take them every day.  She has also used Depo-Provera in the past, but it caused her to have menstrual bleeding daily, so it did not work well for her.  She was currently not on birth control when she had sex this month.  Health Maintenance:  Health Maintenance Due  Topic Date Due  . CHLAMYDIA SCREENING  07/06/2017  . INFLUENZA VACCINE  05/26/2018    -  reports that she has never smoked. She has never used smokeless tobacco. - Review of Systems: Per HPI. - Past Medical  History: Patient Active Problem List   Diagnosis Date Noted  . Possible pregnancy 06/07/2018  . Screen for STD (sexually transmitted disease) 06/07/2018  . Fatigue 03/18/2018  . Unprotected sex 01/25/2018  . History of sexually transmitted disease 12/24/2017  . Vaginal discharge 07/06/2016  . Contraception management 12/27/2014  . Chronic pain of left knee 01/08/2014   - Medications: reviewed and updated   Objective:   Physical Exam BP (!) 100/58   Pulse (!) 101   Temp 98.2 F (36.8 C) (Oral)   Ht 5\' 3"  (1.6 m)   Wt 158 lb 12.8 oz (72 kg)   LMP 05/06/2018 (Exact Date)   SpO2 99%   BMI 28.13 kg/m  Gen: NAD, alert, cooperative with exam, well-appearing Psych: good insight, alert and oriented, appears nervous    Assessment & Plan:   Possible pregnancy Pregnancy test in clinic is positive.  Patient is confident in her decision to terminate the pregnancy.  She has already made an appointment and has time off from work.  Patient was counseled that termination can be a difficult emotional experience and that she should come to the clinic if she is concerned about her mental health.  Screen for STD (sexually transmitted disease) Will test for gonorrhea and chlamydia today.  Contraception management Birth control options reviewed extensively with the patient.  It seems that a LARC would be most beneficial  for her.  She seems motivated to have contraception, but is currently too overwhelmed to consider her options right now.  Patient strongly encouraged to return to the clinic after termination to set up birth control.    Linda Keith, M.D. 06/07/2018, 5:34 PM PGY-2, [facility]

## 2018-06-07 NOTE — Assessment & Plan Note (Signed)
Birth control options reviewed extensively with the patient.  It seems that a LARC would be most beneficial for her.  She seems motivated to have contraception, but is currently too overwhelmed to consider her options right now.  Patient strongly encouraged to return to the clinic after termination to set up birth control.

## 2018-06-07 NOTE — Assessment & Plan Note (Signed)
Will test for gonorrhea and chlamydia today.

## 2018-06-08 LAB — URINE CYTOLOGY ANCILLARY ONLY
Chlamydia: NEGATIVE
Neisseria Gonorrhea: NEGATIVE
TRICH (WINDOWPATH): NEGATIVE

## 2018-06-09 ENCOUNTER — Encounter: Payer: Self-pay | Admitting: Family Medicine

## 2018-06-09 ENCOUNTER — Telehealth: Payer: Self-pay

## 2018-06-09 NOTE — Telephone Encounter (Signed)
Pt informed. Sharon T Saunders, CMA  

## 2018-06-09 NOTE — Telephone Encounter (Signed)
-----   Message from Lennox SoldersAmanda C Winfrey, MD sent at 06/09/2018  4:34 PM EDT ----- Please let [name] know that her chlamydia, gonorrhea, and trichomonas results were normal.  She is already aware that her pregnancy test is positive.

## 2018-06-17 ENCOUNTER — Ambulatory Visit: Payer: Medicaid Other | Admitting: Family Medicine

## 2018-06-20 ENCOUNTER — Encounter: Payer: Self-pay | Admitting: Family Medicine

## 2018-06-29 ENCOUNTER — Other Ambulatory Visit: Payer: Self-pay

## 2018-06-29 ENCOUNTER — Ambulatory Visit (INDEPENDENT_AMBULATORY_CARE_PROVIDER_SITE_OTHER): Payer: Medicaid Other | Admitting: Family Medicine

## 2018-06-29 ENCOUNTER — Encounter: Payer: Self-pay | Admitting: Family Medicine

## 2018-06-29 DIAGNOSIS — Z3A08 8 weeks gestation of pregnancy: Secondary | ICD-10-CM

## 2018-06-29 DIAGNOSIS — Z3481 Encounter for supervision of other normal pregnancy, first trimester: Secondary | ICD-10-CM | POA: Diagnosis not present

## 2018-06-29 MED ORDER — PRENATAL 19 PO CHEW: 1.0000 | CHEWABLE_TABLET | Freq: Every day | ORAL | 8 refills | Status: DC

## 2018-06-29 NOTE — Patient Instructions (Signed)
First Trimester of Pregnancy The first trimester of pregnancy is from week 1 until the end of week 13 (months 1 through 3). A week after a sperm fertilizes an egg, the egg will implant on the wall of the uterus. This embryo will begin to develop into a baby. Genes from you and your partner will form the baby. The female genes will determine whether the baby will be a boy or a girl. At 6-8 weeks, the eyes and face will be formed, and the heartbeat can be seen on ultrasound. At the end of 12 weeks, all the baby's organs will be formed. Now that you are pregnant, you will want to do everything you can to have a healthy baby. Two of the most important things are to get good prenatal care and to follow your health care provider's instructions. Prenatal care is all the medical care you receive before the baby's birth. This care will help prevent, find, and treat any problems during the pregnancy and childbirth. Body changes during your first trimester Your body goes through many changes during pregnancy. The changes vary from woman to woman.  You may gain or lose a couple of pounds at first.  You may feel sick to your stomach (nauseous) and you may throw up (vomit). If the vomiting is uncontrollable, call your health care provider.  You may tire easily.  You may develop headaches that can be relieved by medicines. All medicines should be approved by your health care provider.  You may urinate more often. Painful urination may mean you have a bladder infection.  You may develop heartburn as a result of your pregnancy.  You may develop constipation because certain hormones are causing the muscles that push stool through your intestines to slow down.  You may develop hemorrhoids or swollen veins (varicose veins).  Your breasts may begin to grow larger and become tender. Your nipples may stick out more, and the tissue that surrounds them (areola) may become darker.  Your gums may bleed and may be  sensitive to brushing and flossing.  Dark spots or blotches (chloasma, mask of pregnancy) may develop on your face. This will likely fade after the baby is born.  Your menstrual periods will stop.  You may have a loss of appetite.  You may develop cravings for certain kinds of food.  You may have changes in your emotions from day to day, such as being excited to be pregnant or being concerned that something may go wrong with the pregnancy and baby.  You may have more vivid and strange dreams.  You may have changes in your hair. These can include thickening of your hair, rapid growth, and changes in texture. Some women also have hair loss during or after pregnancy, or hair that feels dry or thin. Your hair will most likely return to normal after your baby is born.  What to expect at prenatal visits During a routine prenatal visit:  You will be weighed to make sure you and the baby are growing normally.  Your blood pressure will be taken.  Your abdomen will be measured to track your baby's growth.  The fetal heartbeat will be listened to between weeks 10 and 14 of your pregnancy.  Test results from any previous visits will be discussed.  Your health care provider may ask you:  How you are feeling.  If you are feeling the baby move.  If you have had any abnormal symptoms, such as leaking fluid, bleeding, severe headaches,   or abdominal cramping.  If you are using any tobacco products, including cigarettes, chewing tobacco, and electronic cigarettes.  If you have any questions.  Other tests that may be performed during your first trimester include:  Blood tests to find your blood type and to check for the presence of any previous infections. The tests will also be used to check for low iron levels (anemia) and protein on red blood cells (Rh antibodies). Depending on your risk factors, or if you previously had diabetes during pregnancy, you may have tests to check for high blood  sugar that affects pregnant women (gestational diabetes).  Urine tests to check for infections, diabetes, or protein in the urine.  An ultrasound to confirm the proper growth and development of the baby.  Fetal screens for spinal cord problems (spina bifida) and Down syndrome.  HIV (human immunodeficiency virus) testing. Routine prenatal testing includes screening for HIV, unless you choose not to have this test.  You may need other tests to make sure you and the baby are doing well.  Follow these instructions at home: Medicines  Follow your health care provider's instructions regarding medicine use. Specific medicines may be either safe or unsafe to take during pregnancy.  Take a prenatal vitamin that contains at least 600 micrograms (mcg) of folic acid.  If you develop constipation, try taking a stool softener if your health care provider approves. Eating and drinking  Eat a balanced diet that includes fresh fruits and vegetables, whole grains, good sources of protein such as meat, eggs, or tofu, and low-fat dairy. Your health care provider will help you determine the amount of weight gain that is right for you.  Avoid raw meat and uncooked cheese. These carry germs that can cause birth defects in the baby.  Eating four or five small meals rather than three large meals a day may help relieve nausea and vomiting. If you start to feel nauseous, eating a few soda crackers can be helpful. Drinking liquids between meals, instead of during meals, also seems to help ease nausea and vomiting.  Limit foods that are high in fat and processed sugars, such as fried and sweet foods.  To prevent constipation: ? Eat foods that are high in fiber, such as fresh fruits and vegetables, whole grains, and beans. ? Drink enough fluid to keep your urine clear or pale yellow. Activity  Exercise only as directed by your health care provider. Most women can continue their usual exercise routine during  pregnancy. Try to exercise for 30 minutes at least 5 days a week. Exercising will help you: ? Control your weight. ? Stay in shape. ? Be prepared for labor and delivery.  Experiencing pain or cramping in the lower abdomen or lower back is a good sign that you should stop exercising. Check with your health care provider before continuing with normal exercises.  Try to avoid standing for long periods of time. Move your legs often if you must stand in one place for a long time.  Avoid heavy lifting.  Wear low-heeled shoes and practice good posture.  You may continue to have sex unless your health care provider tells you not to. Relieving pain and discomfort  Wear a good support bra to relieve breast tenderness.  Take warm sitz baths to soothe any pain or discomfort caused by hemorrhoids. Use hemorrhoid cream if your health care provider approves.  Rest with your legs elevated if you have leg cramps or low back pain.  If you develop   varicose veins in your legs, wear support hose. Elevate your feet for 15 minutes, 3-4 times a day. Limit salt in your diet. Prenatal care  Schedule your prenatal visits by the twelfth week of pregnancy. They are usually scheduled monthly at first, then more often in the last 2 months before delivery.  Write down your questions. Take them to your prenatal visits.  Keep all your prenatal visits as told by your health care provider. This is important. Safety  Wear your seat belt at all times when driving.  Make a list of emergency phone numbers, including numbers for family, friends, the hospital, and police and fire departments. General instructions  Ask your health care provider for a referral to a local prenatal education class. Begin classes no later than the beginning of month 6 of your pregnancy.  Ask for help if you have counseling or nutritional needs during pregnancy. Your health care provider can offer advice or refer you to specialists for help  with various needs.  Do not use hot tubs, steam rooms, or saunas.  Do not douche or use tampons or scented sanitary pads.  Do not cross your legs for long periods of time.  Avoid cat litter boxes and soil used by cats. These carry germs that can cause birth defects in the baby and possibly loss of the fetus by miscarriage or stillbirth.  Avoid all smoking, herbs, alcohol, and medicines not prescribed by your health care provider. Chemicals in these products affect the formation and growth of the baby.  Do not use any products that contain nicotine or tobacco, such as cigarettes and e-cigarettes. If you need help quitting, ask your health care provider. You may receive counseling support and other resources to help you quit.  Schedule a dentist appointment. At home, brush your teeth with a soft toothbrush and be gentle when you floss. Contact a health care provider if:  You have dizziness.  You have mild pelvic cramps, pelvic pressure, or nagging pain in the abdominal area.  You have persistent nausea, vomiting, or diarrhea.  You have a bad smelling vaginal discharge.  You have pain when you urinate.  You notice increased swelling in your face, hands, legs, or ankles.  You are exposed to fifth disease or chickenpox.  You are exposed to German measles (rubella) and have never had it. Get help right away if:  You have a fever.  You are leaking fluid from your vagina.  You have spotting or bleeding from your vagina.  You have severe abdominal cramping or pain.  You have rapid weight gain or loss.  You vomit blood or material that looks like coffee grounds.  You develop a severe headache.  You have shortness of breath.  You have any kind of trauma, such as from a fall or a car accident. Summary  The first trimester of pregnancy is from week 1 until the end of week 13 (months 1 through 3).  Your body goes through many changes during pregnancy. The changes vary from  woman to woman.  You will have routine prenatal visits. During those visits, your health care provider will examine you, discuss any test results you may have, and talk with you about how you are feeling. This information is not intended to replace advice given to you by your health care provider. Make sure you discuss any questions you have with your health care provider. Document Released: 10/06/2001 Document Revised: 09/23/2016 Document Reviewed: 09/23/2016 Elsevier Interactive Patient Education  2018 Elsevier   Inc.  

## 2018-06-29 NOTE — Progress Notes (Deleted)
Subjective:    Linda Keith is being seen today for her first obstetrical visit.  This is not a planned pregnancy. She is at Gestation estimated by LMP on 05/06/2018. Her obstetrical history is not significant for any high risk factors. Relationship with FOB: significant other, unclear living status but patient states he has been and plans to be involved in care of the baby. Patient does intend to breast feed but will supplement with formula. Pregnancy history fully reviewed.  Menstrual History: OB History    Gravida  2   Para  1   Term  1   Preterm      AB      Living  1     SAB      TAB      Ectopic      Multiple  0   Live Births  1           The following portions of the patient's history were reviewed and updated as appropriate: allergies, current medications, past family history, past medical history, past social history, past surgical history and problem list.  Review of Systems Pertinent items noted in HPI and remainder of comprehensive ROS otherwise negative.    Objective:    There were no vitals taken for this visit.                                                                  Assessment:    Pregnancy at 2 and {numbers; 0-7:15237}/7 weeks    Plan:    Initial labs drawn. Prenatal vitamins. Problem list reviewed and updated. AFP3 discussed: declined. Role of ultrasound in pregnancy discussed; fetal survey: requested. Amniocentesis discussed: not indicated at this time. Follow up in 4 weeks.

## 2018-06-30 LAB — OBSTETRIC PANEL, INCLUDING HIV
ANTIBODY SCREEN: NEGATIVE
BASOS: 0 %
Basophils Absolute: 0 10*3/uL (ref 0.0–0.2)
EOS (ABSOLUTE): 0 10*3/uL (ref 0.0–0.4)
EOS: 1 %
HEMATOCRIT: 38 % (ref 34.0–46.6)
HEMOGLOBIN: 12.7 g/dL (ref 11.1–15.9)
HIV SCREEN 4TH GENERATION: NONREACTIVE
Hepatitis B Surface Ag: NEGATIVE
Immature Grans (Abs): 0 10*3/uL (ref 0.0–0.1)
Immature Granulocytes: 0 %
LYMPHS ABS: 1.9 10*3/uL (ref 0.7–3.1)
Lymphs: 36 %
MCH: 23.8 pg — AB (ref 26.6–33.0)
MCHC: 33.4 g/dL (ref 31.5–35.7)
MCV: 71 fL — ABNORMAL LOW (ref 79–97)
MONOS ABS: 0.5 10*3/uL (ref 0.1–0.9)
Monocytes: 9 %
NEUTROS PCT: 54 %
Neutrophils Absolute: 2.8 10*3/uL (ref 1.4–7.0)
Platelets: 270 10*3/uL (ref 150–450)
RBC: 5.33 x10E6/uL — AB (ref 3.77–5.28)
RDW: 15.3 % (ref 12.3–15.4)
RH TYPE: POSITIVE
RPR Ser Ql: NONREACTIVE
Rubella Antibodies, IGG: 4.26 index (ref >0.99)
WBC: 5.2 10*3/uL (ref 3.4–10.8)

## 2018-07-01 ENCOUNTER — Encounter: Payer: Self-pay | Admitting: Family Medicine

## 2018-07-01 LAB — HEMOGLOBIN A1C

## 2018-07-01 LAB — ANTIBODY SCREEN

## 2018-07-01 LAB — HIV ANTIBODY (ROUTINE TESTING W REFLEX)

## 2018-07-01 LAB — CBC

## 2018-07-01 LAB — RUBELLA ANTIBODY, IGM

## 2018-07-01 NOTE — Progress Notes (Signed)
  HPI Linda Keith is being seen today for her first obstetrical visit.  This is not a planned pregnancy. She is at Gestation estimated by LMP on 05/06/2018. Her obstetrical history is not significant for any high risk factors. Relationship with FOB: significant other, unclear living status but patient states he has been and plans to be involved in care of the baby. Patient does intend to breast feed but will supplement with formula. Pregnancy history fully reviewed. She reports no vaginal bleeding, no loss of fluid, and no abdominal pain. Vitals were reviewed for this visit. See flow sheet for details.  Menstrual History: OB History    Gravida  2   Para  1   Term  1   Preterm      AB      Living  1     SAB      TAB      Ectopic      Multiple  0   Live Births  1           Denies fever, chills, abdominal pain, chest pain, SOB, diarrhea, or constipation.  Does endorse nausea worse in the morning with some emesis but not daily and no more than 1-2 times per day.  Objective: General: NAD Cardio: RRR, no murmurs Resp: CTAB, NWOB  A/P:  Initial labs drawn. She is not interested in genetic testing. Prenatal vitamins discussed with patient and she will start taking them. No high risk factors. She did have previous c-section due to CPD. Problem list reviewed and updated. AFP3 discussed: declined. Follow up in 4 weeks. Preterm labor and fetal movement precautions reviewed.

## 2018-07-08 ENCOUNTER — Telehealth: Payer: Self-pay | Admitting: Family Medicine

## 2018-07-08 NOTE — Telephone Encounter (Signed)
Patient still has not received a call to schedule [facility].  Please let her know when this can be scheduled.  Best contact number is [phone]

## 2018-07-08 NOTE — Telephone Encounter (Signed)
LM for patient to call back if she needs further assistance.  Appt made for 07-12-18 at 11am and needs to arrive at 1045 with a full bladder.  Keion Neels,CMA

## 2018-07-12 ENCOUNTER — Ambulatory Visit (HOSPITAL_COMMUNITY)
Admission: RE | Admit: 2018-07-12 | Discharge: 2018-07-12 | Disposition: A | Discharge status: Home / Self Care | Payer: Medicaid Other | Source: Ambulatory Visit | Attending: Family Medicine | Admitting: Family Medicine

## 2018-07-12 DIAGNOSIS — Z3A08 8 weeks gestation of pregnancy: Secondary | ICD-10-CM

## 2018-07-12 DIAGNOSIS — Z3491 Encounter for supervision of normal pregnancy, unspecified, first trimester: Secondary | ICD-10-CM | POA: Insufficient documentation

## 2018-07-12 DIAGNOSIS — Z3A10 10 weeks gestation of pregnancy: Secondary | ICD-10-CM | POA: Insufficient documentation

## 2018-07-18 ENCOUNTER — Encounter: Payer: Self-pay | Admitting: Family Medicine

## 2018-08-02 ENCOUNTER — Ambulatory Visit (INDEPENDENT_AMBULATORY_CARE_PROVIDER_SITE_OTHER): Payer: Medicaid Other | Admitting: Family Medicine

## 2018-08-02 ENCOUNTER — Other Ambulatory Visit: Payer: Self-pay

## 2018-08-02 ENCOUNTER — Encounter: Payer: Self-pay | Admitting: Family Medicine

## 2018-08-02 DIAGNOSIS — Z23 Encounter for immunization: Secondary | ICD-10-CM

## 2018-08-02 NOTE — Progress Notes (Signed)
Linda Keith is a 22 y.o. G2P1001 at [redacted]w[redacted]d here for routine follow up.  She reports no bleeding, no contractions, no cramping and no leaking. See flow sheet for details.  A/P: Pregnancy at [redacted]w[redacted]d.  Doing well.   Pregnancy issues include None. Anatomy ultrasound ordered to be scheduled at 18-19 weeks. Patient is not interested in genetic screening. Bleeding and pain precautions reviewed. Follow up 4 weeks.

## 2018-08-02 NOTE — Patient Instructions (Signed)
Second Trimester of Pregnancy The second trimester is from week 13 through week 28, month 4 through 6. This is often the time in pregnancy that you feel your best. Often times, morning sickness has lessened or quit. You may have more energy, and you may get hungry more often. Your unborn baby (fetus) is growing rapidly. At the end of the sixth month, he or she is about 9 inches long and weighs about 1 pounds. You will likely feel the baby move (quickening) between 18 and 20 weeks of pregnancy. Follow these instructions at home:  Avoid all smoking, herbs, and alcohol. Avoid drugs not approved by your doctor.  Do not use any tobacco products, including cigarettes, chewing tobacco, and electronic cigarettes. If you need help quitting, ask your doctor. You may get counseling or other support to help you quit.  Only take medicine as told by your doctor. Some medicines are safe and some are not during pregnancy.  Exercise only as told by your doctor. Stop exercising if you start having cramps.  Eat regular, healthy meals.  Wear a good support bra if your breasts are tender.  Do not use hot tubs, steam rooms, or saunas.  Wear your seat belt when driving.  Avoid raw meat, uncooked cheese, and liter boxes and soil used by cats.  Take your prenatal vitamins.  Take 1500-2000 milligrams of calcium daily starting at the 20th week of pregnancy until you deliver your baby.  Try taking medicine that helps you poop (stool softener) as needed, and if your doctor approves. Eat more fiber by eating fresh fruit, vegetables, and whole grains. Drink enough fluids to keep your pee (urine) clear or pale yellow.  Take warm water baths (sitz baths) to soothe pain or discomfort caused by hemorrhoids. Use hemorrhoid cream if your doctor approves.  If you have puffy, bulging veins (varicose veins), wear support hose. Raise (elevate) your feet for 15 minutes, 3-4 times a day. Limit salt in your diet.  Avoid heavy  lifting, wear low heals, and sit up straight.  Rest with your legs raised if you have leg cramps or low back pain.  Visit your dentist if you have not gone during your pregnancy. Use a soft toothbrush to brush your teeth. Be gentle when you floss.  You can have sex (intercourse) unless your doctor tells you not to.  Go to your doctor visits. Get help if:  You feel dizzy.  You have mild cramps or pressure in your lower belly (abdomen).  You have a nagging pain in your belly area.  You continue to feel sick to your stomach (nauseous), throw up (vomit), or have watery poop (diarrhea).  You have bad smelling fluid coming from your vagina.  You have pain with peeing (urination). Get help right away if:  You have a fever.  You are leaking fluid from your vagina.  You have spotting or bleeding from your vagina.  You have severe belly cramping or pain.  You lose or gain weight rapidly.  You have trouble catching your breath and have chest pain.  You notice sudden or extreme puffiness (swelling) of your face, hands, ankles, feet, or legs.  You have not felt the baby move in over an hour.  You have severe headaches that do not go away with medicine.  You have vision changes. This information is not intended to replace advice given to you by your health care provider. Make sure you discuss any questions you have with your health care   provider. Document Released: 01/06/2010 Document Revised: 03/19/2016 Document Reviewed: 12/13/2012 Elsevier Interactive Patient Education  2017 Elsevier Inc.  

## 2018-08-19 ENCOUNTER — Encounter: Payer: Self-pay | Admitting: Family Medicine

## 2018-08-19 ENCOUNTER — Ambulatory Visit (INDEPENDENT_AMBULATORY_CARE_PROVIDER_SITE_OTHER): Payer: Medicaid Other | Admitting: Family Medicine

## 2018-08-19 ENCOUNTER — Other Ambulatory Visit: Payer: Self-pay | Admitting: Family Medicine

## 2018-08-19 ENCOUNTER — Other Ambulatory Visit: Payer: Self-pay

## 2018-08-19 VITALS — BP 105/58 | HR 87 | Temp 98.7°F | Wt 166.0 lb

## 2018-08-19 DIAGNOSIS — O26892 Other specified pregnancy related conditions, second trimester: Secondary | ICD-10-CM | POA: Diagnosis not present

## 2018-08-19 DIAGNOSIS — Z3A15 15 weeks gestation of pregnancy: Secondary | ICD-10-CM

## 2018-08-19 DIAGNOSIS — R51 Headache: Secondary | ICD-10-CM | POA: Diagnosis not present

## 2018-08-19 DIAGNOSIS — R519 Headache, unspecified: Secondary | ICD-10-CM

## 2018-08-19 DIAGNOSIS — Z3491 Encounter for supervision of normal pregnancy, unspecified, first trimester: Secondary | ICD-10-CM

## 2018-08-19 LAB — POCT URINALYSIS DIP (MANUAL ENTRY)
Bilirubin, UA: NEGATIVE
Blood, UA: NEGATIVE
GLUCOSE UA: NEGATIVE mg/dL
Ketones, POC UA: NEGATIVE mg/dL
LEUKOCYTES UA: NEGATIVE
NITRITE UA: NEGATIVE
PROTEIN UA: NEGATIVE mg/dL
Spec Grav, UA: 1.025 (ref 1.010–1.025)
Urobilinogen, UA: 1 E.U./dL
pH, UA: 7 (ref 5.0–8.0)

## 2018-08-19 MED ORDER — MAGNESIUM OXIDE 400 MG PO TABS: 400.0000 mg | ORAL_TABLET | Freq: Every day | ORAL | 0 refills | Status: DC

## 2018-08-19 NOTE — Progress Notes (Addendum)
Linda Keith is a 22 y.o. G2P1001 at [redacted]w[redacted]d for routine follow up.  She reports no bleeding, no contractions, no cramping, no leaking and headaches See flow sheet for details.  A/P: Pregnancy at 108w0d.  Doing well.   Pregnancy issues include: Hx of STDs Anatomy ultrasound not ordered to be scheduled at 18-19 weeks. Pt  is interested in genetic screening. Lab was not collected before she left. I called her and discussed this with her. She will return on 08/23/18 to get her lab drawn. Lab appointment scheduled. PCMH and PHQ9 reviewed and no red flags. Bleeding and pain precautions reviewed. Follow up 4 weeks.  Headache: Neuro exam benign. No signs of meningeal irritation. Urinalysis neg for protein. BP looks good although she stated that few days ago, her BP was 130 systolic.  BP monitoring recommended and was advised to go to the MAU if BP is >135/80. SHe verbalized understanding. Start Magnesium Oxide Qd for headache for 2 weeks. Continue Tylenol as needed for pain. If headache restart after 2 weeks, she may need MgO for a prolonged period. Return precaution discussed.

## 2018-08-19 NOTE — Progress Notes (Signed)
Subjective:     Patient ID: Linda Keith, female   DOB: 05-09-1996, 22 y.o.   MRN: [id number]  Headache   This is a new problem. Episode onset: 3 weeks. The problem occurs intermittently. The problem has been waxing and waning. The pain is located in the right unilateral (could be left side or the right side of her head) region. The pain radiates to the left neck and right neck. The pain quality is not similar to prior headaches. The quality of the pain is described as aching, shooting and throbbing. The pain is at a severity of 7/10 (Could get to a 10/10 in severity). The pain is moderate. Associated symptoms include phonophobia and photophobia. Pertinent negatives include no blurred vision, dizziness, fever, loss of balance, nausea, seizures or vomiting. Nothing aggravates the symptoms. Treatments tried: Tylenol  The treatment provided mild relief. There is no history of migraine headaches or migraines in the family. (No head trauma)   Current Outpatient Medications on File Prior to Visit  Medication Sig Dispense Refill  . Prenatal Vit-Fe Fumarate-FA (PRENATAL 19) tablet Chew 1 tablet by mouth daily. 30 tablet 8   No current facility-administered medications on file prior to visit.    Past Medical History:  Diagnosis Date  . Anemia    Vitals:   08/19/18 1139  BP: (!) 105/58  Pulse: 87  Temp: 98.7 F (37.1 C)  Weight: 166 lb (75.3 kg)     Review of Systems  Constitutional: Negative for fever.  Eyes: Positive for photophobia. Negative for blurred vision.  Respiratory: Negative.   Cardiovascular: Negative.   Gastrointestinal: Negative.  Negative for nausea and vomiting.  Neurological: Positive for headaches. Negative for dizziness, seizures and loss of balance.  All other systems reviewed and are negative.      Objective:   Physical Exam  Constitutional: She appears well-developed. She does not appear ill.  Eyes: Pupils are equal, round, and reactive to light.  Conjunctivae and EOM are normal.  Fundoscopic exam:      The right eye shows no exudate, no hemorrhage and no papilledema.       The left eye shows no exudate, no hemorrhage and no papilledema.  Cardiovascular: Normal rate, regular rhythm and normal heart sounds.  No murmur heard. Pulmonary/Chest: Effort normal. No respiratory distress. She has no wheezes.  Abdominal: Soft. She exhibits no distension. There is no tenderness.  Musculoskeletal: Normal range of motion. She exhibits no edema.  Neurological: She is alert. She has normal strength and normal reflexes. No cranial nerve deficit or sensory deficit. She displays a negative Romberg sign.  Nursing note and vitals reviewed.  Urinalysis    Component Value Date/Time   BILIRUBINUR negative 08/19/2018   BILIRUBINUR neg 07/06/2016 1421   KETONESUR negative 08/19/2018   PROTEINUR negative 08/19/2018   PROTEINUR 30 mg 07/06/2016 1421   UROBILINOGEN 1.0 08/19/2018   NITRITE Negative 08/19/2018   NITRITE neg 07/06/2016 1421   LEUKOCYTESUR Negative 08/19/2018        Assessment:     Prenatal care Headache: ?? Migraine headache in pregnancy.    Plan:     See prenatal note. F/U appointment already scheduled with Dr. Sydnee Cabal.  Neuro exam benign. No signs of meningeal irritation. Urinalysis neg for protein. BP looks good although she stated that few days ago, her BP was 130 systolic.  BP monitoring recommended and was advised to go to the MAU if BP is >135/80. SHe verbalized understanding. Start Magnesium Oxide Qd for  headache for 2 weeks. Continue Tylenol as needed for pain. If headache restart after 2 weeks, she may need MgO for a prolonged period. Return precaution discussed.

## 2018-08-19 NOTE — Patient Instructions (Addendum)
Please start Magnesium Oxide 400 mg qd for headache. Use Tylenol as needed for pain. Continue to monitor BP at home. If more than 135/80, please got to the hospital. If headache persist or worsen, please go to the hospital.    Headaches During Pregnancy:  Causes and Treatment Headaches are one of the most common discomforts experienced during pregnancy. Headaches may occur at any time during your pregnancy, but they tend to be most common during the first and third trimesters.  What causes headaches during pregnancy? During the first trimester, your body experiences a surge of hormones and an increase in blood volume.  These two changes can cause more frequent headaches. These headaches may be further aggravated by stress, poor posture or changes in your vision. Other causes of headaches during pregnancy may involve one or more of the following: Lack of sleep Low blood sugar Dehydration Caffeine withdrawal Stress (due to many changes) Women who have regular migraine headaches may discover that they experience fewer migraines during pregnancy; however, some women may encounter the same number or even more migraine headaches. If you are pregnant, it is important to talk to your health care provider about any medications that you may be taking for headaches. Headaches during the third trimester tend to be related more often to poor posture and tension from carrying extra weight. Headaches during the third trimester may also be caused by a condition called preeclampsia, which is high blood pressure during pregnancy.  What can you do to treat headaches during pregnancy? The best way to deal with headaches is to avoid them altogether. Avoiding tension headaches is easiest when you follow these tips: Practice good posture (especially during the third trimester) Get plenty of rest and relaxation Exercise Eat well-balanced meals Apply cold or heat packs to your head  If you are not able to  prevent headaches, there are still steps you can take to help them go away. During pregnancy, you want to try and relieve your headache by natural means if possible. Pain relief medications such as aspirin and ibuprofen are not recommended in most pregnancies; however, acetaminophen may be recommended by your health care provider.  You may want to try to relieve your headache with one or more of the following natural remedies: If you have a sinus headache, apply a warm compress around your eyes and nose If you have a tension headache, apply a cold compress or ice pack at the base of your neck Maintain your blood sugar by eating smaller, more frequent meals - this may also help prevent future headaches Get a massage - massaging your shoulders and neck is an effective way to relieve pain Rest in a dark room and practice deep breathing Take a warm shower or bath Applying heat or cold to the sides of the head, the eyes, or along the back of the neck is one of the best ways to reduce or relieve the pain associated with a headache. Heating pads and cold packs come in a variety of shapes and sizes, but most require using a microwave or the freezer first. Another drawback with some of these is that the heat or cold subsides as time goes on. You may also reduce the likelihood of migraine headaches by avoiding common triggers of migraine headaches. Potential triggers include: Chocolate Alcohol Yogurt Aged cheese Peanuts Bread with fresh yeast Preserved meats Sour cream When should you contact your health care provider? Unfortunately, headaches are a normal part of pregnancy; however, you should be  able to experience some relief. Contact your health care provider: Before taking any medications If you do not experience any relief from the remedies above Your headaches get worse or more persistent You experience headaches that are different than normal Your headaches are accompanied by blurry vision,  sudden weight gain, pain in the upper right abdomen, and swelling in the hands and face

## 2018-08-22 LAB — URINE CULTURE, OB REFLEX

## 2018-08-22 LAB — CULTURE, OB URINE

## 2018-08-23 ENCOUNTER — Other Ambulatory Visit: Payer: Medicaid Other

## 2018-08-23 ENCOUNTER — Telehealth: Payer: Self-pay | Admitting: Family Medicine

## 2018-08-23 NOTE — Telephone Encounter (Signed)
I called back. Urine result discussed with her. Repeat culture in 4 weeks or sooner if symptomatic. She missed her lab appointment for Quad screen. This was rescheduled. She is advised to call soon if she needed to reschedule her lab appointment. She verbalized understanding.

## 2018-08-23 NOTE — Telephone Encounter (Signed)
HIPAA compliant call back message left.   Note: Urine culture grew multiple urogenital flora. No treatment required. Repeat urine culture in about 4 weeks or if symptomatic. She is supposed to come to the lab today for Quad screening. Please help reschedule if she is unable to make it in today.

## 2018-08-25 ENCOUNTER — Other Ambulatory Visit: Payer: Medicaid Other

## 2018-08-29 ENCOUNTER — Telehealth: Payer: Self-pay | Admitting: Family Medicine

## 2018-08-29 NOTE — Telephone Encounter (Signed)
Pt is calling to let Dr. Sydnee Cabal know that her employer faxed her FMLA paper work to our office. She wanted to let him know that they said it should be filled out as intermediate so she can use the leave immediately and not only once the baby is born.

## 2018-08-30 ENCOUNTER — Telehealth: Payer: Self-pay | Admitting: Family Medicine

## 2018-08-30 NOTE — Telephone Encounter (Signed)
Attempted to contact pt to remind them of their multiple appointments for tomorrow on 08/31/2018. -CH

## 2018-08-31 ENCOUNTER — Encounter: Payer: Medicaid Other | Admitting: Family Medicine

## 2018-08-31 ENCOUNTER — Encounter: Payer: Self-pay | Admitting: Family Medicine

## 2018-08-31 ENCOUNTER — Ambulatory Visit (INDEPENDENT_AMBULATORY_CARE_PROVIDER_SITE_OTHER): Payer: Medicaid Other | Admitting: Family Medicine

## 2018-08-31 DIAGNOSIS — Z3491 Encounter for supervision of normal pregnancy, unspecified, first trimester: Secondary | ICD-10-CM

## 2018-08-31 NOTE — Progress Notes (Signed)
Linda Keith is a 22 y.o. G2P1001 at [redacted]w[redacted]d here for routine follow up.  She reports no bleeding, no contractions, no cramping and no leaking. See flow sheet for details.  A/P: Pregnancy at [redacted]w[redacted]d.  Doing well.   Pregnancy issues include: Hx of STDs Anatomy ultrasound ordered to be scheduled at 18-19 weeks. Patient is interested in genetic screening and is scheduled for AFP today. Bleeding and pain precautions reviewed. Follow up 4 weeks.

## 2018-08-31 NOTE — Patient Instructions (Addendum)
Second Trimester of Pregnancy The second trimester is from week 13 through week 28, month 4 through 6. This is often the time in pregnancy that you feel your best. Often times, morning sickness has lessened or quit. You may have more energy, and you may get hungry more often. Your unborn baby (fetus) is growing rapidly. At the end of the sixth month, he or she is about 9 inches long and weighs about 1 pounds. You will likely feel the baby move (quickening) between 18 and 20 weeks of pregnancy. Follow these instructions at home:  Avoid all smoking, herbs, and alcohol. Avoid drugs not approved by your doctor.  Do not use any tobacco products, including cigarettes, chewing tobacco, and electronic cigarettes. If you need help quitting, ask your doctor. You may get counseling or other support to help you quit.  Only take medicine as told by your doctor. Some medicines are safe and some are not during pregnancy.  Exercise only as told by your doctor. Stop exercising if you start having cramps.  Eat regular, healthy meals.  Wear a good support bra if your breasts are tender.  Do not use hot tubs, steam rooms, or saunas.  Wear your seat belt when driving.  Avoid raw meat, uncooked cheese, and liter boxes and soil used by cats.  Take your prenatal vitamins.  Take 1500-2000 milligrams of calcium daily starting at the 20th week of pregnancy until you deliver your baby.  Try taking medicine that helps you poop (stool softener) as needed, and if your doctor approves. Eat more fiber by eating fresh fruit, vegetables, and whole grains. Drink enough fluids to keep your pee (urine) clear or pale yellow.  Take warm water baths (sitz baths) to soothe pain or discomfort caused by hemorrhoids. Use hemorrhoid cream if your doctor approves.  If you have puffy, bulging veins (varicose veins), wear support hose. Raise (elevate) your feet for 15 minutes, 3-4 times a day. Limit salt in your diet.  Avoid heavy  lifting, wear low heals, and sit up straight.  Rest with your legs raised if you have leg cramps or low back pain.  Visit your dentist if you have not gone during your pregnancy. Use a soft toothbrush to brush your teeth. Be gentle when you floss.  You can have sex (intercourse) unless your doctor tells you not to.  Go to your doctor visits. Get help if:  You feel dizzy.  You have mild cramps or pressure in your lower belly (abdomen).  You have a nagging pain in your belly area.  You continue to feel sick to your stomach (nauseous), throw up (vomit), or have watery poop (diarrhea).  You have bad smelling fluid coming from your vagina.  You have pain with peeing (urination). Get help right away if:  You have a fever.  You are leaking fluid from your vagina.  You have spotting or bleeding from your vagina.  You have severe belly cramping or pain.  You lose or gain weight rapidly.  You have trouble catching your breath and have chest pain.  You notice sudden or extreme puffiness (swelling) of your face, hands, ankles, feet, or legs.  You have not felt the baby move in over an hour.  You have severe headaches that do not go away with medicine.  You have vision changes. This information is not intended to replace advice given to you by your health care provider. Make sure you discuss any questions you have with your health care   provider. Document Released: 01/06/2010 Document Revised: 03/19/2016 Document Reviewed: 12/13/2012 Elsevier Interactive Patient Education  2017 Elsevier Inc.  

## 2018-09-02 ENCOUNTER — Other Ambulatory Visit: Payer: Self-pay | Admitting: Family Medicine

## 2018-09-02 DIAGNOSIS — Z3490 Encounter for supervision of normal pregnancy, unspecified, unspecified trimester: Secondary | ICD-10-CM | POA: Insufficient documentation

## 2018-09-02 DIAGNOSIS — Z3491 Encounter for supervision of normal pregnancy, unspecified, first trimester: Secondary | ICD-10-CM

## 2018-09-07 ENCOUNTER — Encounter (HOSPITAL_COMMUNITY): Payer: Self-pay

## 2018-09-15 ENCOUNTER — Ambulatory Visit (HOSPITAL_COMMUNITY)
Admission: RE | Admit: 2018-09-15 | Discharge: 2018-09-15 | Disposition: A | Discharge status: Home / Self Care | Payer: Medicaid Other | Source: Ambulatory Visit | Attending: Family Medicine | Admitting: Family Medicine

## 2018-09-15 DIAGNOSIS — Z3A18 18 weeks gestation of pregnancy: Secondary | ICD-10-CM | POA: Insufficient documentation

## 2018-09-15 DIAGNOSIS — O34219 Maternal care for unspecified type scar from previous cesarean delivery: Secondary | ICD-10-CM | POA: Diagnosis not present

## 2018-09-15 DIAGNOSIS — Z363 Encounter for antenatal screening for malformations: Secondary | ICD-10-CM

## 2018-09-15 DIAGNOSIS — Z3491 Encounter for supervision of normal pregnancy, unspecified, first trimester: Secondary | ICD-10-CM

## 2018-09-20 ENCOUNTER — Encounter: Payer: Self-pay | Admitting: Family Medicine

## 2018-09-22 ENCOUNTER — Encounter (HOSPITAL_COMMUNITY): Payer: Self-pay

## 2018-09-22 ENCOUNTER — Other Ambulatory Visit: Payer: Self-pay

## 2018-09-22 ENCOUNTER — Inpatient Hospital Stay (HOSPITAL_COMMUNITY)
Admission: AD | Admit: 2018-09-22 | Discharge: 2018-09-22 | Disposition: A | Discharge status: Home / Self Care | Payer: Medicaid Other | Source: Ambulatory Visit | Attending: Obstetrics and Gynecology | Admitting: Obstetrics and Gynecology

## 2018-09-22 DIAGNOSIS — Z79899 Other long term (current) drug therapy: Secondary | ICD-10-CM | POA: Insufficient documentation

## 2018-09-22 DIAGNOSIS — O26892 Other specified pregnancy related conditions, second trimester: Secondary | ICD-10-CM | POA: Diagnosis not present

## 2018-09-22 DIAGNOSIS — Z3A19 19 weeks gestation of pregnancy: Secondary | ICD-10-CM | POA: Insufficient documentation

## 2018-09-22 DIAGNOSIS — K920 Hematemesis: Secondary | ICD-10-CM | POA: Diagnosis not present

## 2018-09-22 DIAGNOSIS — R109 Unspecified abdominal pain: Secondary | ICD-10-CM | POA: Diagnosis present

## 2018-09-22 LAB — URINALYSIS, ROUTINE W REFLEX MICROSCOPIC
Bilirubin Urine: NEGATIVE
Glucose, UA: NEGATIVE mg/dL
Hgb urine dipstick: NEGATIVE
KETONES UR: 5 mg/dL — AB
Leukocytes, UA: NEGATIVE
NITRITE: NEGATIVE
PH: 6 (ref 5.0–8.0)
Protein, ur: NEGATIVE mg/dL
SPECIFIC GRAVITY, URINE: 1.025 (ref 1.005–1.030)

## 2018-09-22 MED ORDER — ONDANSETRON 4 MG PO TBDP: 4.0000 mg | ORAL_TABLET | Freq: Four times a day (QID) | ORAL | 2 refills | Status: DC | PRN

## 2018-09-22 MED ORDER — DOXYLAMINE-PYRIDOXINE 10-10 MG PO TBEC: DELAYED_RELEASE_TABLET | ORAL | 3 refills | Status: DC

## 2018-09-22 NOTE — MAU Note (Signed)
Pt reports to MAU c/o vomiting x1 time after dinner tonight pt states she saw some blood in her vomit and she decided to be evaluated. Pt also reports cramping since her vomiting episode. +FM. No bleeding or LOF.

## 2018-09-22 NOTE — MAU Provider Note (Signed)
None     Chief Complaint:  Abdominal Pain and Emesis   Linda Keith is  22 y.o. G2P1001 at 3178w6d presents complaining of Abdominal Pain and Emesis .  Vomits every day, but today it had bright red blood in it. Photo she took of her lips following vomiting showed some dark pink/red material on her lips.  No vomiting since.  Had some abdominal cramping following emesis. Denies reflux/heartburn.  Feels better now but was worried about seeing blood.   Obstetrical/Gynecological History: OB History    Gravida  2   Para  1   Term  1   Preterm      AB      Living  1     SAB      TAB      Ectopic      Multiple  0   Live Births  1          Past Medical History: Past Medical History:  Diagnosis Date  . Anemia     Past Surgical History: Past Surgical History:  Procedure Laterality Date  . CESAREAN SECTION N/A 11/14/2014   Procedure: CESAREAN SECTION;  Surgeon: Catalina AntiguaPeggy Constant, MD;  Location: [facility];  Service: Obstetrics;  Laterality: N/A;  . NO PAST SURGERIES      Family History: Family History  Problem Relation Age of Onset  . Cardiomyopathy Mother   . Peripheral Artery Disease Mother     Social History: Social History   Tobacco Use  . Smoking status: Never Smoker  . Smokeless tobacco: Never Used  Substance Use Topics  . Alcohol use: No    Alcohol/week: 0.0 standard drinks  . Drug use: No    Allergies: No Known Allergies  Meds:  Medications Prior to Admission  Medication Sig Dispense Refill Last Dose  . magnesium oxide (MAG-OX) 400 MG tablet Take 1 tablet (400 mg total) by mouth daily. 30 tablet 0   . Prenatal Vit-Fe Fumarate-FA (PRENATAL 19) tablet Chew 1 tablet by mouth daily. 30 tablet 8 Taking    Review of Systems   Constitutional: Negative for fever and chills Eyes: Negative for visual disturbances Respiratory: Negative for shortness of breath, dyspnea Cardiovascular: Negative for chest pain or palpitations  Gastrointestinal: Negative  for diarrhea and constipation Genitourinary: Negative for dysuria and urgency Musculoskeletal: Negative for back pain, joint pain, myalgias.  Normal ROM  Neurological: Negative for dizziness and headaches    Physical Exam  Blood pressure 127/71, pulse 94, temperature 98.7 F (37.1 C), temperature source Oral, resp. rate 18, weight 79.6 kg, last menstrual period 05/06/2018. GENERAL: Well-developed, well-nourished female in no acute distress.  LUNGS: Normal respiratory effort HEART: Regular rate and rhythm. ABDOMEN: Soft, nontender, nondistended, gravid FHT via doppler.  EXTREMITIES: Nontender, no edema, 2+ distal pulses. DTR's 2+    Labs: Results for orders placed or performed during the hospital encounter of 09/22/18 (from the past 24 hour(s))  Urinalysis, Routine w reflex microscopic   Collection Time: 09/22/18  8:07 PM  Result Value Ref Range   Color, Urine YELLOW YELLOW   APPearance HAZY (A) CLEAR   Specific Gravity, Urine 1.025 1.005 - 1.030   pH 6.0 5.0 - 8.0   Glucose, UA NEGATIVE NEGATIVE mg/dL   Hgb urine dipstick NEGATIVE NEGATIVE   Bilirubin Urine NEGATIVE NEGATIVE   Ketones, ur 5 (A) NEGATIVE mg/dL   Protein, ur NEGATIVE NEGATIVE mg/dL   Nitrite NEGATIVE NEGATIVE   Leukocytes, UA NEGATIVE NEGATIVE   Imaging Studies:  Assessment: Linda Keith is  22 y.o. G2P1001 at [redacted]w[redacted]d presents with hematemesis X 1, mild Chronic N/V of pregnancy.  Plan: Doesn't have any antiemetics.  Rx diclegis and zofran  Jacklyn Shell 11/28/20198:29 PM

## 2018-09-22 NOTE — Discharge Instructions (Signed)
Hematemesis °Hematemesis is when you vomit blood. It is a sign of bleeding in the upper part of your digestive tract. This is also called your gastrointestinal (GI) tract. Your upper GI tract includes your mouth, throat, esophagus, stomach, and the first part of your small intestine (duodenum). °Hematemesis is usually caused by bleeding from your esophagus or stomach. You may suddenly vomit bright red blood. You might also vomit old blood. It may look like coffee grounds. You may also have other symptoms, such as: °· Stomach pain. °· Heartburn. °· Black and tarry stool. ° °Follow these instructions at home: °Watch your hematemesis for any changes. The following actions may help to lessen any discomfort you are feeling: °· Take medicines only as directed by your health care provider. Do not take aspirin, ibuprofen, or any other anti-inflammatory medicine without approval from your health care provider. °· Rest as needed. °· Drink small sips of clear liquids often, as long as you can keep them down. Try to drink enough fluids to keep your urine clear or pale yellow. °· Do not drink alcohol. °· Do not use any tobacco products, including cigarettes, chewing tobacco, or electronic cigarettes. If you need help quitting, ask your health care provider. °· Keep all follow-up visits as directed by your health care provider. This is important. ° °Contact a health care provider if: °· The vomiting of blood worsens, or begins again after it has stopped. °· You have persistent stomach pain. °· You have nausea, indigestion, or heartburn. °· You feel weak or dizzy. °Get help right away if: °· You faint or feel extremely weak. °· You have a rapid heartbeat. °· You are urinating less than normal or not at all. °· You have persistent vomiting. °· You vomit large amounts of bloody or dark material. °· You vomit bright red blood. °· You pass large, dark, or bloody stools. °· You have chest pain or trouble breathing. °This information is  not intended to replace advice given to you by your health care provider. Make sure you discuss any questions you have with your health care provider. °Document Released: 11/19/2004 Document Revised: 03/19/2016 Document Reviewed: 06/06/2014 °Elsevier Interactive Patient Education © 2018 Elsevier Inc. ° °

## 2018-09-22 NOTE — MAU Note (Signed)
Urine in lab 

## 2018-10-07 ENCOUNTER — Other Ambulatory Visit (HOSPITAL_COMMUNITY)
Admission: RE | Admit: 2018-10-07 | Discharge: 2018-10-07 | Disposition: A | Discharge status: Home / Self Care | Payer: Medicaid Other | Source: Ambulatory Visit | Attending: Family Medicine | Admitting: Family Medicine

## 2018-10-07 ENCOUNTER — Other Ambulatory Visit: Payer: Self-pay

## 2018-10-07 ENCOUNTER — Ambulatory Visit (INDEPENDENT_AMBULATORY_CARE_PROVIDER_SITE_OTHER): Payer: Medicaid Other | Admitting: Family Medicine

## 2018-10-07 VITALS — BP 100/54 | HR 91 | Temp 99.3°F | Wt 173.8 lb

## 2018-10-07 DIAGNOSIS — Z124 Encounter for screening for malignant neoplasm of cervix: Secondary | ICD-10-CM | POA: Diagnosis not present

## 2018-10-07 DIAGNOSIS — Z3482 Encounter for supervision of other normal pregnancy, second trimester: Secondary | ICD-10-CM

## 2018-10-07 NOTE — Progress Notes (Signed)
Linda Keith is a [age] y.o. G2P1001 at 5968w0d here for routine follow up.  She reports a little bit of nausea. Reports good fetal movement. No leaking or bleeding or ctx.   See flow sheet for details.  A/P: Pregnancy at 2768w0d.  Doing well.   Pregnancy issues include none Anatomy scan reviewed, problems are noted.  Preterm labor precautions reviewed. Follow up 4 weeks.  [name], DO PGY-3, [facility] 10/07/2018 2:13 PM

## 2018-10-07 NOTE — Patient Instructions (Signed)
Second Trimester of Pregnancy The second trimester is from week 13 through week 28, month 4 through 6. This is often the time in pregnancy that you feel your best. Often times, morning sickness has lessened or quit. You may have more energy, and you may get hungry more often. Your unborn baby (fetus) is growing rapidly. At the end of the sixth month, he or she is about 9 inches long and weighs about 1 pounds. You will likely feel the baby move (quickening) between 18 and 20 weeks of pregnancy. Follow these instructions at home:  Avoid all smoking, herbs, and alcohol. Avoid drugs not approved by your doctor.  Do not use any tobacco products, including cigarettes, chewing tobacco, and electronic cigarettes. If you need help quitting, ask your doctor. You may get counseling or other support to help you quit.  Only take medicine as told by your doctor. Some medicines are safe and some are not during pregnancy.  Exercise only as told by your doctor. Stop exercising if you start having cramps.  Eat regular, healthy meals.  Wear a good support bra if your breasts are tender.  Do not use hot tubs, steam rooms, or saunas.  Wear your seat belt when driving.  Avoid raw meat, uncooked cheese, and liter boxes and soil used by cats.  Take your prenatal vitamins.  Take 1500-2000 milligrams of calcium daily starting at the 20th week of pregnancy until you deliver your baby.  Try taking medicine that helps you poop (stool softener) as needed, and if your doctor approves. Eat more fiber by eating fresh fruit, vegetables, and whole grains. Drink enough fluids to keep your pee (urine) clear or pale yellow.  Take warm water baths (sitz baths) to soothe pain or discomfort caused by hemorrhoids. Use hemorrhoid cream if your doctor approves.  If you have puffy, bulging veins (varicose veins), wear support hose. Raise (elevate) your feet for 15 minutes, 3-4 times a day. Limit salt in your diet.  Avoid heavy  lifting, wear low heals, and sit up straight.  Rest with your legs raised if you have leg cramps or low back pain.  Visit your dentist if you have not gone during your pregnancy. Use a soft toothbrush to brush your teeth. Be gentle when you floss.  You can have sex (intercourse) unless your doctor tells you not to.  Go to your doctor visits. Get help if:  You feel dizzy.  You have mild cramps or pressure in your lower belly (abdomen).  You have a nagging pain in your belly area.  You continue to feel sick to your stomach (nauseous), throw up (vomit), or have watery poop (diarrhea).  You have bad smelling fluid coming from your vagina.  You have pain with peeing (urination). Get help right away if:  You have a fever.  You are leaking fluid from your vagina.  You have spotting or bleeding from your vagina.  You have severe belly cramping or pain.  You lose or gain weight rapidly.  You have trouble catching your breath and have chest pain.  You notice sudden or extreme puffiness (swelling) of your face, hands, ankles, feet, or legs.  You have not felt the baby move in over an hour.  You have severe headaches that do not go away with medicine.  You have vision changes. This information is not intended to replace advice given to you by your health care provider. Make sure you discuss any questions you have with your health care   provider. Document Released: 01/06/2010 Document Revised: 03/19/2016 Document Reviewed: 12/13/2012 Elsevier Interactive Patient Education  2017 Elsevier Inc.  

## 2018-10-09 ENCOUNTER — Inpatient Hospital Stay (HOSPITAL_COMMUNITY)
Admission: AD | Admit: 2018-10-09 | Discharge: 2018-10-09 | Disposition: A | Discharge status: Home / Self Care | Payer: Medicaid Other | Source: Ambulatory Visit | Attending: Obstetrics & Gynecology | Admitting: Obstetrics & Gynecology

## 2018-10-09 ENCOUNTER — Telehealth: Payer: Self-pay | Admitting: Advanced Practice Midwife

## 2018-10-09 DIAGNOSIS — Z3A22 22 weeks gestation of pregnancy: Secondary | ICD-10-CM | POA: Diagnosis not present

## 2018-10-09 DIAGNOSIS — O26892 Other specified pregnancy related conditions, second trimester: Secondary | ICD-10-CM | POA: Diagnosis not present

## 2018-10-09 DIAGNOSIS — O99512 Diseases of the respiratory system complicating pregnancy, second trimester: Secondary | ICD-10-CM | POA: Diagnosis present

## 2018-10-09 DIAGNOSIS — J209 Acute bronchitis, unspecified: Secondary | ICD-10-CM | POA: Diagnosis not present

## 2018-10-09 DIAGNOSIS — Z331 Pregnant state, incidental: Secondary | ICD-10-CM

## 2018-10-09 DIAGNOSIS — J069 Acute upper respiratory infection, unspecified: Secondary | ICD-10-CM

## 2018-10-09 DIAGNOSIS — J029 Acute pharyngitis, unspecified: Secondary | ICD-10-CM | POA: Diagnosis present

## 2018-10-09 DIAGNOSIS — J101 Influenza due to other identified influenza virus with other respiratory manifestations: Secondary | ICD-10-CM

## 2018-10-09 LAB — INFLUENZA PANEL BY PCR (TYPE A & B)
Influenza A By PCR: NEGATIVE
Influenza B By PCR: POSITIVE — AB

## 2018-10-09 MED ORDER — OSELTAMIVIR PHOSPHATE 75 MG PO CAPS: 75.0000 mg | ORAL_CAPSULE | Freq: Two times a day (BID) | ORAL | 0 refills | Status: AC

## 2018-10-09 MED ORDER — ONDANSETRON 8 MG PO TBDP
8.0000 mg | ORAL_TABLET | Freq: Once | ORAL | Status: AC
  Administered 2018-10-09: 8 mg via ORAL
  Filled 2018-10-09: qty 1

## 2018-10-09 MED ORDER — ONDANSETRON 8 MG PO TBDP: 8.0000 mg | ORAL_TABLET | Freq: Three times a day (TID) | ORAL | 2 refills | Status: DC | PRN

## 2018-10-09 NOTE — MAU Note (Signed)
Unsure if she has a fever but she is feeling hot  Has a headache, sore throat, cough, and runny nose since Friday. Tried tylenol with no relief  No abdominal pain, vaginal bleeding, or discharge

## 2018-10-09 NOTE — Telephone Encounter (Signed)
Inform patient of positive influenza B.  Rx Tamiflu sent to pharmacy.  Recommended patient wear mask in public and increase fluids and rest.  Requested work note, left at front desk.   Katrinka BlazingSmith, [place], CNM 10/09/2018 8:20 PM

## 2018-10-09 NOTE — MAU Provider Note (Signed)
Chief Complaint: Headache; Sore Throat; Cough; and Nasal Congestion   First Provider Initiated Contact with Patient 10/09/18 1742     SUBJECTIVE HPI: Linda Keith is a 22 y.o. G2P1001 at 7255w2d who presents [facility] reporting sore throat, congestion, rhinorrhea, body aches x 3 days.   Modifying factors: Body aches improved w/ Tylenol Associated signs and symptoms: Has not checked temperature.  Denies chills, cough.  No vaginal bleeding, leaking of fluid.  Positive fetal movement.  Past Medical History:  Diagnosis Date  . Anemia    OB History  Gravida Para Term Preterm AB Living  2 1 1     1   SAB TAB Ectopic Multiple Live Births        0 1    # Outcome Date GA Lbr Len/2nd Weight Sex Delivery Anes PTL Lv  2 Current           1 Term 11/14/14 1278w3d 19:54 / 03:37 3620 g M CS-LTranv EPI  LIV   Past Surgical History:  Procedure Laterality Date  . CESAREAN SECTION N/A 11/14/2014   Procedure: CESAREAN SECTION;  Surgeon: Catalina AntiguaPeggy Constant, MD;  Location: [facility];  Service: Obstetrics;  Laterality: N/A;  . NO PAST SURGERIES     Social History   Socioeconomic History  . Marital status: Single    Spouse name: Not on file  . Number of children: Not on file  . Years of education: Not on file  . Highest education level: Not on file  Occupational History  . Not on file  Social Needs  . Financial resource strain: Not on file  . Food insecurity:    Worry: Not on file    Inability: Not on file  . Transportation needs:    Medical: Not on file    Non-medical: Not on file  Tobacco Use  . Smoking status: Never Smoker  . Smokeless tobacco: Never Used  Substance and Sexual Activity  . Alcohol use: No    Alcohol/week: 0.0 standard drinks  . Drug use: No  . Sexual activity: Yes    Birth control/protection: None    Comment: Wants Nexplanon after pregnancy  Lifestyle  . Physical activity:    Days per week: Not on file    Minutes per session: Not on file  . Stress: Not  on file  Relationships  . Social connections:    Talks on phone: Not on file    Gets together: Not on file    Attends religious service: Not on file    Active member of club or organization: Not on file    Attends meetings of clubs or organizations: Not on file    Relationship status: Not on file  . Intimate partner violence:    Fear of current or ex partner: Not on file    Emotionally abused: Not on file    Physically abused: Not on file    Forced sexual activity: Not on file  Other Topics Concern  . Not on file  Social History Narrative  . Not on file   Family History  Problem Relation Age of Onset  . Cardiomyopathy Mother   . Peripheral Artery Disease Mother    No current facility-administered medications on file prior to encounter.    Current Outpatient Medications on File Prior to Encounter  Medication Sig Dispense Refill  . Doxylamine-Pyridoxine 10-10 MG TBEC 2 PO qhs; may take 1po in am and 1po in afternoon prn nausea 120 tablet 3  . Prenatal Vit-Fe  Fumarate-FA (PRENATAL 19) tablet Chew 1 tablet by mouth daily. 30 tablet 8   No Known Allergies  I have reviewed patient's Past Medical Hx, Surgical Hx, Family Hx, Social Hx, medications and allergies.   Review of Systems  Constitutional: Positive for fatigue. Negative for appetite change and chills.  HENT: Positive for congestion, rhinorrhea and sore throat. Negative for ear pain, sinus pressure, sneezing and trouble swallowing.   Respiratory: Negative for cough, shortness of breath and wheezing.   Gastrointestinal: Negative for abdominal pain, nausea and vomiting.  Genitourinary: Negative for vaginal bleeding.  Musculoskeletal: Positive for myalgias.  Neurological: Negative for headaches.    OBJECTIVE Patient Vitals for the past 24 hrs:  BP Temp Temp src Pulse Resp Weight  10/09/18 1726 93/73 99.1 F (37.3 C) Oral (!) 114 20 81 kg   Constitutional: Well-developed, well-nourished female in no acute distress.   Mildly ill-appearing. Head:.  Throat without erythema or exudate.  Tonsils normal size. Cardiovascular: Mild tachycardia Respiratory: normal rate and effort.  GI: Abd gravid appropriate for gestational age.  MS: Extremities nontender, no edema, normal ROM Neurologic: Alert and oriented x 4.  GU: Deferred  FHR 166 by doppler  LAB RESULTS No results found for this or any previous visit (from the past 24 hour(s)).  IMAGING NA  MAU COURSE Orders Placed This Encounter  Procedures  . Influenza panel by PCR (type A & B)  . Discharge patient   Meds ordered this encounter  Medications  . ondansetron (ZOFRAN-ODT) disintegrating tablet 8 mg  . ondansetron (ZOFRAN-ODT) 8 MG disintegrating tablet    Sig: Take 1 tablet (8 mg total) by mouth every 8 (eight) hours as needed for nausea.    Dispense:  20 tablet    Refill:  2    Order Specific Question:   Supervising Provider    Answer:   Willodean Rosenthal [phone]    MDM -Patient prefers to leave and be called with results of flu PCR.  Explained that I will prescribe Tamiflu for her if she is positive otherwise her symptoms are from the common cold.  Gave her a list of medication she can take for her symptoms..  ASSESSMENT 1. URI, acute     PLAN Discharge home in stable condition. Flu precautions Follow-up Information    [facility] Follow up.   Why:  call to schedule next return OB appointment  Contact information: [address] [place] [address] [phone]       [facility] Follow up.   Specialty:  Obstetrics and Gynecology Why:  as needed in pregnancy emergencies Contact information: [id number] [address] [place] [address] [phone]         Allergies as of 10/09/2018   No Known Allergies     Medication List    TAKE these medications   Doxylamine-Pyridoxine 10-10 MG Tbec 2 PO qhs; may take 1po in am and 1po in  afternoon prn nausea   ondansetron 8 MG disintegrating tablet Commonly known as:  ZOFRAN ODT Take 1 tablet (8 mg total) by mouth every 8 (eight) hours as needed for nausea. What changed:    medication strength  how much to take  when to take this   PRENATAL 19 tablet Chew 1 tablet by mouth daily.        Katrinka Blazing, [place], [place] 10/09/2018  5:45 PM

## 2018-10-09 NOTE — Discharge Instructions (Signed)
Upper Respiratory Infection, Adult Most upper respiratory infections (URIs) are a viral infection of the air passages leading to the lungs. A URI affects the nose, throat, and upper air passages. The most common type of URI is nasopharyngitis and is typically referred to as "the common cold." URIs run their course and usually go away on their own. Most of the time, a URI does not require medical attention, but sometimes a bacterial infection in the upper airways can follow a viral infection. This is called a secondary infection. Sinus and middle ear infections are common types of secondary upper respiratory infections. Bacterial pneumonia can also complicate a URI. A URI can worsen asthma and chronic obstructive pulmonary disease (COPD). Sometimes, these complications can require emergency medical care and may be life threatening. What are the causes? Almost all URIs are caused by viruses. A virus is a type of germ and can spread from one person to another. What increases the risk? You may be at risk for a URI if:  You smoke.  You have chronic heart or lung disease.  You have a weakened defense (immune) system.  You are very young or very old.  You have nasal allergies or asthma.  You work in crowded or poorly ventilated areas.  You work in health care facilities or schools.  What are the signs or symptoms? Symptoms typically develop 2-3 days after you come in contact with a cold virus. Most viral URIs last 7-10 days. However, viral URIs from the influenza virus (flu virus) can last 14-18 days and are typically more severe. Symptoms may include:  Runny or stuffy (congested) nose.  Sneezing.  Cough.  Sore throat.  Headache.  Fatigue.  Fever.  Loss of appetite.  Pain in your forehead, behind your eyes, and over your cheekbones (sinus pain).  Muscle aches.  How is this diagnosed? Your health care provider may diagnose a URI by:  Physical exam.  Tests to check that your  symptoms are not due to another condition such as: ? Strep throat. ? Sinusitis. ? Pneumonia. ? Asthma.  How is this treated? A URI goes away on its own with time. It cannot be cured with medicines, but medicines may be prescribed or recommended to relieve symptoms. Medicines may help:  Reduce your fever.  Reduce your cough.  Relieve nasal congestion.  Follow these instructions at home:  Take medicines only as directed by your health care provider.  Gargle warm saltwater or take cough drops to comfort your throat as directed by your health care provider.  Use a warm mist humidifier or inhale steam from a shower to increase air moisture. This may make it easier to breathe.  Drink enough fluid to keep your urine clear or pale yellow.  Eat soups and other clear broths and maintain good nutrition.  Rest as needed.  Return to work when your temperature has returned to normal or as your health care provider advises. You may need to stay home longer to avoid infecting others. You can also use a face mask and careful hand washing to prevent spread of the virus.  Increase the usage of your inhaler if you have asthma.  Do not use any tobacco products, including cigarettes, chewing tobacco, or electronic cigarettes. If you need help quitting, ask your health care provider. How is this prevented? The best way to protect yourself from getting a cold is to practice good hygiene.  Avoid oral or hand contact with people with cold symptoms.  Wash your   hands often if contact occurs.  There is no clear evidence that vitamin C, vitamin E, echinacea, or exercise reduces the chance of developing a cold. However, it is always recommended to get plenty of rest, exercise, and practice good nutrition. Contact a health care provider if:  You are getting worse rather than better.  Your symptoms are not controlled by medicine.  You have chills.  You have worsening shortness of breath.  You have  brown or red mucus.  You have yellow or brown nasal discharge.  You have pain in your face, especially when you bend forward.  You have a fever.  You have swollen neck glands.  You have pain while swallowing.  You have white areas in the back of your throat. Get help right away if:  You have severe or persistent: ? Headache. ? Ear pain. ? Sinus pain. ? Chest pain.  You have chronic lung disease and any of the following: ? Wheezing. ? Prolonged cough. ? Coughing up blood. ? A change in your usual mucus.  You have a stiff neck.  You have changes in your: ? Vision. ? Hearing. ? Thinking. ? Mood. This information is not intended to replace advice given to you by your health care provider. Make sure you discuss any questions you have with your health care provider. Document Released: 04/07/2001 Document Revised: 06/14/2016 Document Reviewed: 01/17/2014 Elsevier Interactive Patient Education  2018 [employer] Inc[employer]  

## 2018-10-10 ENCOUNTER — Encounter (HOSPITAL_COMMUNITY): Payer: Self-pay

## 2018-10-10 ENCOUNTER — Emergency Department (HOSPITAL_COMMUNITY)
Admission: EM | Admit: 2018-10-10 | Discharge: 2018-10-10 | Disposition: A | Discharge status: Home / Self Care | Payer: Medicaid Other | Attending: Emergency Medicine | Admitting: Emergency Medicine

## 2018-10-10 ENCOUNTER — Other Ambulatory Visit: Payer: Self-pay

## 2018-10-10 DIAGNOSIS — Z79899 Other long term (current) drug therapy: Secondary | ICD-10-CM | POA: Diagnosis not present

## 2018-10-10 DIAGNOSIS — J101 Influenza due to other identified influenza virus with other respiratory manifestations: Secondary | ICD-10-CM | POA: Diagnosis not present

## 2018-10-10 DIAGNOSIS — R112 Nausea with vomiting, unspecified: Secondary | ICD-10-CM

## 2018-10-10 DIAGNOSIS — R040 Epistaxis: Secondary | ICD-10-CM | POA: Diagnosis not present

## 2018-10-10 LAB — I-STAT CHEM 8, ED
BUN: 5 mg/dL — AB (ref 6–20)
Calcium, Ion: 1.12 mmol/L — ABNORMAL LOW (ref 1.15–1.40)
Chloride: 106 mmol/L (ref 98–111)
Creatinine, Ser: 0.5 mg/dL (ref 0.44–1.00)
Glucose, Bld: 91 mg/dL (ref 70–99)
HCT: 34 % — ABNORMAL LOW (ref 36.0–46.0)
Hemoglobin: 11.6 g/dL — ABNORMAL LOW (ref 12.0–15.0)
Potassium: 3.9 mmol/L (ref 3.5–5.1)
Sodium: 134 mmol/L — ABNORMAL LOW (ref 135–145)
TCO2: 23 mmol/L (ref 22–32)

## 2018-10-10 MED ORDER — SODIUM CHLORIDE 0.9 % IV BOLUS
1000.0000 mL | Freq: Once | INTRAVENOUS | Status: AC
  Administered 2018-10-10: 1000 mL via INTRAVENOUS

## 2018-10-10 MED ORDER — METOCLOPRAMIDE HCL 5 MG/ML IJ SOLN
10.0000 mg | Freq: Once | INTRAMUSCULAR | Status: AC
  Administered 2018-10-10: 10 mg via INTRAVENOUS
  Filled 2018-10-10: qty 2

## 2018-10-10 NOTE — ED Notes (Signed)
Patient verbalizes understanding of discharge instructions. Opportunity for questioning and answers were provided. Armband removed by staff, pt discharged from ED. Pt ambulatory to lobby.  

## 2018-10-10 NOTE — ED Provider Notes (Signed)
[facility] Provider Note   CSN: [id number] Arrival date & time: 10/10/18  1454     History   Chief Complaint Chief Complaint  Patient presents with  . Flu  . Epistaxis    HPI Linda Keith is a [age] y.o. female who presents with epistaxis and vomiting. She is currently [redacted] weeks pregnant. She states that she started feeling bad several days ago. She's had fever, chills, nasal congestion/runny nose, sore throat, and a cough. [facility] and she tested positive for Influenza B. She was given an rx for Tamiflu and Zofran. She has started these today but reports having an episode of heavy nose bleeds that would last 30-45 minutes and several episodes of nausea and vomiting and feels lightheaded with sitting up. She denies abdominal or pelvic pain, diarrhea, urinary symptoms, vaginal bleeding or leakage of fluids.  HPI  Past Medical History:  Diagnosis Date  . Anemia     Patient Active Problem List   Diagnosis Date Noted  . Supervision of low-risk pregnancy 09/02/2018  . Fatigue 03/18/2018  . History of sexually transmitted disease 12/24/2017  . Prenatal care 08/07/2014  . Chronic pain of left knee 01/08/2014    Past Surgical History:  Procedure Laterality Date  . CESAREAN SECTION N/A 11/14/2014   Procedure: CESAREAN SECTION;  Surgeon: Catalina AntiguaPeggy Constant, MD;  Location: [facility]S;  Service: Obstetrics;  Laterality: N/A;  . NO PAST SURGERIES       OB History    Gravida  2   Para  1   Term  1   Preterm      AB      Living  1     SAB      TAB      Ectopic      Multiple  0   Live Births  1            Home Medications    Prior to Admission medications   Medication Sig Start Date End Date Taking? Authorizing Provider  Doxylamine-Pyridoxine 10-10 MG TBEC 2 PO qhs; may take 1po in am and 1po in afternoon prn nausea 09/22/18   Cresenzo-Dishmon, Scarlette CalicoFrances, CNM  ondansetron (ZOFRAN-ODT) 8 MG disintegrating  tablet Take 1 tablet (8 mg total) by mouth every 8 (eight) hours as needed for nausea. 10/09/18   Katrinka BlazingSmith, [place], CNM  oseltamivir (TAMIFLU) 75 MG capsule Take 1 capsule (75 mg total) by mouth 2 (two) times daily for 5 days. 10/09/18 10/14/18  Dorathy KinsmanSmith, [place], CNM  Prenatal Vit-Fe Fumarate-FA (PRENATAL 19) tablet Chew 1 tablet by mouth daily. 06/29/18   Arlyce HarmanLockamy, Timothy, DO    Family History Family History  Problem Relation Age of Onset  . Cardiomyopathy Mother   . Peripheral Artery Disease Mother     Social History Social History   Tobacco Use  . Smoking status: Never Smoker  . Smokeless tobacco: Never Used  Substance Use Topics  . Alcohol use: No    Alcohol/week: 0.0 standard drinks  . Drug use: No     Allergies   Patient has no known allergies.   Review of Systems Review of Systems  Constitutional: Positive for chills and fever.  HENT: Positive for congestion, nosebleeds, rhinorrhea and sore throat. Negative for ear pain.   Respiratory: Positive for cough. Negative for shortness of breath.   Cardiovascular: Negative for chest pain.  Gastrointestinal: Positive for nausea and vomiting. Negative for abdominal pain and diarrhea.  Genitourinary: Negative  for dysuria.  All other systems reviewed and are negative.    Physical Exam Updated Vital Signs BP 122/76 (BP Location: Right Arm)   Pulse (!) 109   Temp 98.8 F (37.1 C) (Oral)   Resp 18   LMP 05/06/2018 (Exact Date)   SpO2 99%   Physical Exam Vitals signs and nursing note reviewed.  Constitutional:      General: She is not in acute distress.    Appearance: She is well-developed. She is ill-appearing (mildly).  HENT:     Head: Normocephalic and atraumatic.     Right Ear: Hearing, tympanic membrane, ear canal and external ear normal.     Left Ear: Hearing, tympanic membrane, ear canal and external ear normal.     Nose: Mucosal edema present.     Right Nostril: No epistaxis.     Left Nostril: No epistaxis.      Right Turbinates: Enlarged and swollen.     Left Turbinates: Enlarged and swollen.     Mouth/Throat:     Mouth: Mucous membranes are dry.     Pharynx: Oropharynx is clear.     Tonsils: Swelling: 1+ on the right. 1+ on the left.  Eyes:     General: No scleral icterus.       Right eye: No discharge.        Left eye: No discharge.     Conjunctiva/sclera: Conjunctivae normal.     Pupils: Pupils are equal, round, and reactive to light.  Neck:     Musculoskeletal: Normal range of motion.  Cardiovascular:     Rate and Rhythm: Regular rhythm. Tachycardia present.     Heart sounds: No murmur. No friction rub. No gallop.   Pulmonary:     Effort: Pulmonary effort is normal. No respiratory distress.     Breath sounds: Normal breath sounds.  Abdominal:     General: There is distension ([redacted] weeks pregnant).     Palpations: Abdomen is soft.     Tenderness: There is no abdominal tenderness.  Skin:    General: Skin is warm and dry.  Neurological:     Mental Status: She is alert and oriented to person, place, and time.  Psychiatric:        Behavior: Behavior normal.      ED Treatments / Results  Labs (all labs ordered are listed, but only abnormal results are displayed) Labs Reviewed  I-STAT CHEM 8, ED    EKG None  Radiology No results found.  Procedures Procedures (including critical care time)  Medications Ordered in ED Medications  sodium chloride 0.9 % bolus 1,000 mL (has no administration in time range)  metoCLOPramide (REGLAN) injection 10 mg (has no administration in time range)     Initial Impression / Assessment and Plan / ED Course  I have reviewed the triage vital signs and the nursing notes.  Pertinent labs & imaging results that were available during my care of the patient were reviewed by me and considered in my medical decision making (see chart for details).  22 year old female presents with epistaxis and vomiting. She currently has the flu and is [redacted] weeks  pregnant. She is mildly tachycardic but otherwise vitals are normal. Lungs are CTA. No pelvic pain or vaginal bleeding. She feels dehydrated and lightheaded despite taking Zofran at home. Will give fluids and Reglan and reassess. Will check Chem 8.   Chem-8 shows mild anemia. She is finishing fluids. PO challenge ordered. Care transferred to [facility]  NP who will dispo after PO challenge.  Final Clinical Impressions(s) / ED Diagnoses   Final diagnoses:  Epistaxis  Influenza A  Nausea and vomiting, intractability of vomiting not specified, unspecified vomiting type    ED Discharge Orders    None       Bethel Born, PA-C 10/10/18 1610    Donnetta Hutching, MD 10/11/18 [phone]

## 2018-10-10 NOTE — ED Notes (Signed)
ED Provider at bedside. 

## 2018-10-10 NOTE — ED Provider Notes (Signed)
Results for orders placed or performed during the hospital encounter of 10/10/18  I-stat Chem 8, ED  Result Value Ref Range   Sodium 134 (L) 135 - 145 mmol/L   Potassium 3.9 3.5 - 5.1 mmol/L   Chloride 106 98 - 111 mmol/L   BUN 5 (L) 6 - 20 mg/dL   Creatinine, Ser 1.610.50 0.44 - 1.00 mg/dL   Glucose, Bld 91 70 - 99 mg/dL   Calcium, Ion 0.961.12 (L) 1.15 - 1.40 mmol/L   TCO2 23 22 - 32 mmol/L   Hemoglobin 11.6 (L) 12.0 - 15.0 g/dL   HCT 04.534.0 (L) 40.936.0 - 81.146.0 %    Patient feels better after IV fluids and medication. She is tolerating oral fluids without difficulty. Patient states she is ready to return home. Patient has zofran available. Care instructions provided. Return precautions discussed.   Felicie MornSmith, Lynnelle Mesmer, NP 10/10/18 1655    Donnetta Hutchingook, Brian, MD 10/11/18 [phone]

## 2018-10-10 NOTE — ED Notes (Signed)
Pt able to tolerate PO fluids.  

## 2018-10-10 NOTE — ED Triage Notes (Signed)
Pt reports flu like symptoms since Friday. Pt is [redacted] weeks pregnant, [facility] and [facility] told her she had the flu, gave her tamiflu and pt is now having nose bleeds (3 today) reports big clots.

## 2018-10-10 NOTE — ED Notes (Signed)
Pt stated that she finished PO fluids successfully.

## 2018-10-11 ENCOUNTER — Encounter: Payer: Self-pay | Admitting: Family Medicine

## 2018-10-11 LAB — CYTOLOGY - PAP
Chlamydia: NEGATIVE
DIAGNOSIS: NEGATIVE
NEISSERIA GONORRHEA: NEGATIVE
Trichomonas: NEGATIVE

## 2018-10-12 ENCOUNTER — Ambulatory Visit: Payer: Medicaid Other | Admitting: Family Medicine

## 2018-10-12 VITALS — BP 100/60 | HR 87 | Temp 97.6°F | Wt 176.0 lb

## 2018-10-12 DIAGNOSIS — J101 Influenza due to other identified influenza virus with other respiratory manifestations: Secondary | ICD-10-CM | POA: Diagnosis present

## 2018-10-12 MED ORDER — OSELTAMIVIR PHOSPHATE 75 MG PO CAPS: 75.0000 mg | ORAL_CAPSULE | Freq: Two times a day (BID) | ORAL | 0 refills | Status: DC

## 2018-10-12 NOTE — Patient Instructions (Signed)
It was great meeting you today! I am sorry that you are not feeling well from the flu. I am not sure why you were only given 5 tamiflu tablets. I will send 5 more to the pharmacy. You can try some over the counter stuff for cold and flu. Only tylenol for pain. I think you should be out of work until Monday. I gave you an excuse for the next three days.

## 2018-10-12 NOTE — Progress Notes (Signed)
   HPI 22 year old who presents with cough, chills, fever, muscle aches.  She was diagnosed with the flu on 10/09/2018 via rapid flu test.  She was started on Tamiflu.  Patient states that she has been feeling better but feels a little worse this morning.  Her current symptoms are sore throat, cough, headache, fatigue.  The swab came back positive for flu B  CC: Influenza B   ROS:   Review of Systems See HPI for ROS.   CC, SH/smoking status, and VS noted  Objective: BP 100/60   Pulse 87   Temp 97.6 F (36.4 C) (Oral)   Wt 176 lb (79.8 kg)   LMP 05/06/2018 (Exact Date)   SpO2 100%   BMI 31.18 kg/m  Gen: 22 year old female, resting comfortably in bed, no acute distress HEENT: Moist Mucous membranes, swollen nasal turbinates CV: RRR, no murmur Resp: CTAB, no wheezes, non-labored   Assessment and plan:  Influenza B For some reason patient will received 5 Tamiflu pills.  Upon checking prescription she was prescribed 10.  Will prescribe the other 5.  Discussed supportive care with patient.  Can take Tylenol for fever and muscle aches.  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   No orders of the defined types were placed in this encounter.   Meds ordered this encounter  Medications  . oseltamivir (TAMIFLU) 75 MG capsule    Sig: Take 1 capsule (75 mg total) by mouth 2 (two) times daily.    Dispense:  5 capsule    Refill:  0     Myrene BuddyJacob Liley Rake MD PGY-2 Family Medicine Resident  10/12/2018 1:43 PM

## 2018-10-12 NOTE — Assessment & Plan Note (Signed)
For some reason patient will received 5 Tamiflu pills.  Upon checking prescription she was prescribed 10.  Will prescribe the other 5.  Discussed supportive care with patient.  Can take Tylenol for fever and muscle aches.  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

## 2018-10-26 HISTORY — DX: Maternal care for unspecified type scar from previous cesarean delivery: O34.219

## 2018-10-26 NOTE — L&D Delivery Note (Addendum)
OB/[facility]N Faculty Practice Delivery Note  Linda Keith is a 23 y.o. G2P1001 s/p SVD at 106w2d. She was admitted for SOL and bloody show.   ROM: 0h 32m clear with blood tinged  fluid GBS Status: negative Maximum Maternal Temperature: 98.1  Labor Progress: . AROM shortly before delivery  Delivery Date/Time: 1219 01/22/2019 Delivery: Called to room and patient was complete and pushing. Head delivered ROA with large gush of blood. No nuchal cord present. Shoulder and body delivered in usual fashion. Infant with weak cry, placed on mother's abdomen, dried and stimulated. Cord clamped x 2 and cut immediately by Dr. Darin Engels. Cord blood drawn, cord pH also drawn. Placenta delivered spontaneously with gentle cord traction. Fundus firm with massage and Pitocin but with continued passage of clots, given 800mg  rectal cytotec. Labia, perineum, vagina, and cervix inspected inspected with 1st degree perineal laceration noted, repaired in the usual fashion with 3.0 vicryl, hemostatic afterwards.   Placenta: delivered spontaneously intact, clot on portion of placenta, sent for pathology Complications: partial placental abruption Lacerations: 1st degree perineal  EBL:  Infant: female with decreased tone and weak cry  APGARs 5 and 8  weight pending  Burman Nieves, MD Family Medicine Resident   Attestation: I have seen this patient and agree with the resident's documentation. I was present for the entire delivery and assisted with the repair.   Cristal Deer. Earlene Plater, DO OB/GYN Fellow

## 2018-11-01 ENCOUNTER — Ambulatory Visit (INDEPENDENT_AMBULATORY_CARE_PROVIDER_SITE_OTHER): Payer: Medicaid Other | Admitting: Family Medicine

## 2018-11-01 ENCOUNTER — Encounter: Payer: Self-pay | Admitting: Family Medicine

## 2018-11-01 ENCOUNTER — Other Ambulatory Visit: Payer: Self-pay

## 2018-11-01 VITALS — BP 120/54 | HR 111 | Temp 98.4°F | Wt 174.4 lb

## 2018-11-01 DIAGNOSIS — Z3491 Encounter for supervision of normal pregnancy, unspecified, first trimester: Secondary | ICD-10-CM | POA: Diagnosis present

## 2018-11-01 LAB — POCT 1 HR PRENATAL GLUCOSE: Glucose 1 Hr Prenatal, POC: 112 mg/dL

## 2018-11-01 NOTE — Patient Instructions (Signed)

## 2018-11-01 NOTE — Addendum Note (Signed)
Addended by: Lovena Neighbours F on: 11/01/2018 10:41 AM   Modules accepted: Level of Service

## 2018-11-01 NOTE — Progress Notes (Signed)
Linda Keith is a 23 y.o. G2P1001 at [redacted]w[redacted]d here for routine follow up.  She reports good fetal movement and denies any leaking, bleeding or contractions. See flow sheet for details.  A/P: Pregnancy at [redacted]w[redacted]d.  Doing well.   Pregnancy issues include. None 1 hour Glucola done today Will need CBC, RPR and HIV at next visit Preterm labor and fetal movement precautions reviewed. Follow up 4 weeks.  Lovena Neighbours, MD [facility], PGY-3

## 2018-11-02 ENCOUNTER — Encounter: Payer: Self-pay | Admitting: Family Medicine

## 2018-11-28 ENCOUNTER — Ambulatory Visit: Payer: Medicaid Other

## 2018-11-29 ENCOUNTER — Ambulatory Visit (INDEPENDENT_AMBULATORY_CARE_PROVIDER_SITE_OTHER): Payer: Medicaid Other

## 2018-11-29 DIAGNOSIS — Z111 Encounter for screening for respiratory tuberculosis: Secondary | ICD-10-CM

## 2018-11-29 NOTE — Progress Notes (Signed)
Pt presents in nurse clinic for PPD placement. Injection given left forearm, wheel present. Pt to return to nurse clinic 2/6 @1040am , reminder card given.

## 2018-12-01 ENCOUNTER — Ambulatory Visit: Payer: Medicaid Other

## 2018-12-02 ENCOUNTER — Other Ambulatory Visit: Payer: Self-pay

## 2018-12-02 ENCOUNTER — Ambulatory Visit (INDEPENDENT_AMBULATORY_CARE_PROVIDER_SITE_OTHER): Payer: Medicaid Other | Admitting: Family Medicine

## 2018-12-02 VITALS — BP 110/64 | HR 92 | Temp 98.3°F | Wt 180.0 lb

## 2018-12-02 DIAGNOSIS — Z3493 Encounter for supervision of normal pregnancy, unspecified, third trimester: Secondary | ICD-10-CM | POA: Diagnosis not present

## 2018-12-02 MED ORDER — TETANUS-DIPHTH-ACELL PERTUSSIS 5-2.5-18.5 LF-MCG/0.5 IM SUSP
0.5000 mL | Freq: Once | INTRAMUSCULAR | Status: AC
  Administered 2018-12-02: 0.5 mL via INTRAMUSCULAR

## 2018-12-02 NOTE — Patient Instructions (Signed)

## 2018-12-02 NOTE — Progress Notes (Addendum)
Linda Keith is a 23 y.o. G2P1001 at [redacted]w[redacted]d here for routine follow up.  She reports good fetal movement, no leakage of fluid, no bleeding, and no contractions. See flow sheet for details.  Vitals:   12/02/18 1002  BP: 110/64  Pulse: 92  Temp: 98.3 F (36.8 C)   Physical Exam: Resp: CTAB, NWOB Cardio: RRR, no murmurs, gallops or rubs FHR 136-140 Fundal Height: 30cm  A/P: Pregnancy at [redacted]w[redacted]d.  Doing well.   Pregnancy issues include: None  Obtained CBC, RPR, and HIV labs today.  Infant feeding choice: Breast and bottle Contraception choice: Considering Nexplanon Infant circumcision desired yes  Tdap was given today. 1 hour glucola was normal at last visit; CBC, RPR, and HIV were done today.   Rh status was reviewed and patient does not need Rhogam. She is O positive.  Preterm labor and fetal movement precautions reviewed. Follow up 2 weeks with Dr. Shawnie Pons due to having previous C-section. Patient does express desire for TOLAC.

## 2018-12-03 LAB — CBC
Hematocrit: 30.9 % — ABNORMAL LOW (ref 34.0–46.6)
Hemoglobin: 10 g/dL — ABNORMAL LOW (ref 11.1–15.9)
MCH: 24 pg — ABNORMAL LOW (ref 26.6–33.0)
MCHC: 32.4 g/dL (ref 31.5–35.7)
MCV: 74 fL — ABNORMAL LOW (ref 79–97)
PLATELETS: 190 10*3/uL (ref 150–450)
RBC: 4.16 x10E6/uL (ref 3.77–5.28)
RDW: 14.5 % (ref 11.7–15.4)
WBC: 4.3 10*3/uL (ref 3.4–10.8)

## 2018-12-03 LAB — HIV ANTIBODY (ROUTINE TESTING W REFLEX): HIV Screen 4th Generation wRfx: NONREACTIVE

## 2018-12-03 LAB — RPR: RPR Ser Ql: NONREACTIVE

## 2018-12-05 ENCOUNTER — Other Ambulatory Visit: Payer: Self-pay | Admitting: Family Medicine

## 2018-12-05 DIAGNOSIS — D509 Iron deficiency anemia, unspecified: Secondary | ICD-10-CM

## 2018-12-05 NOTE — Progress Notes (Signed)
MCV of 77

## 2018-12-07 LAB — IRON,TIBC AND FERRITIN PANEL
Ferritin: 24 ng/mL (ref 15–150)
Iron Saturation: 30 % (ref 15–55)
Iron: 109 ug/dL (ref 27–159)
Total Iron Binding Capacity: 368 ug/dL (ref 250–450)
UIBC: 259 ug/dL (ref 131–425)

## 2018-12-07 LAB — SPECIMEN STATUS REPORT

## 2018-12-12 ENCOUNTER — Other Ambulatory Visit: Payer: Self-pay

## 2018-12-12 ENCOUNTER — Encounter: Payer: Self-pay | Admitting: Family Medicine

## 2018-12-12 ENCOUNTER — Ambulatory Visit (INDEPENDENT_AMBULATORY_CARE_PROVIDER_SITE_OTHER): Payer: Medicaid Other | Admitting: Family Medicine

## 2018-12-12 VITALS — BP 118/60 | HR 94 | Temp 98.6°F | Ht 63.0 in | Wt 181.0 lb

## 2018-12-12 DIAGNOSIS — Z3493 Encounter for supervision of normal pregnancy, unspecified, third trimester: Secondary | ICD-10-CM

## 2018-12-12 DIAGNOSIS — O34219 Maternal care for unspecified type scar from previous cesarean delivery: Secondary | ICD-10-CM

## 2018-12-12 NOTE — Patient Instructions (Addendum)
 Breastfeeding  Choosing to breastfeed is one of the best decisions you can make for yourself and your baby. A change in hormones during pregnancy causes your breasts to make breast milk in your milk-producing glands. Hormones prevent breast milk from being released before your baby is born. They also prompt milk flow after birth. Once breastfeeding has begun, thoughts of your baby, as well as his or her sucking or crying, can stimulate the release of milk from your milk-producing glands. Benefits of breastfeeding Research shows that breastfeeding offers many health benefits for infants and mothers. It also offers a cost-free and convenient way to feed your baby. For your baby  Your first milk (colostrum) helps your baby's digestive system to function better.  Special cells in your milk (antibodies) help your baby to fight off infections.  Breastfed babies are less likely to develop asthma, allergies, obesity, or type 2 diabetes. They are also at lower risk for sudden infant death syndrome (SIDS).  Nutrients in breast milk are better able to meet your baby's needs compared to infant formula.  Breast milk improves your baby's brain development. For you  Breastfeeding helps to create a very special bond between you and your baby.  Breastfeeding is convenient. Breast milk costs nothing and is always available at the correct temperature.  Breastfeeding helps to burn calories. It helps you to lose the weight that you gained during pregnancy.  Breastfeeding makes your uterus return faster to its size before pregnancy. It also slows bleeding (lochia) after you give birth.  Breastfeeding helps to lower your risk of developing type 2 diabetes, osteoporosis, rheumatoid arthritis, cardiovascular disease, and breast, ovarian, uterine, and endometrial cancer later in life. Breastfeeding basics Starting breastfeeding  Find a comfortable place to sit or lie down, with your neck and back  well-supported.  Place a pillow or a rolled-up blanket under your baby to bring him or her to the level of your breast (if you are seated). Nursing pillows are specially designed to help support your arms and your baby while you breastfeed.  Make sure that your baby's tummy (abdomen) is facing your abdomen.  Gently massage your breast. With your fingertips, massage from the outer edges of your breast inward toward the nipple. This encourages milk flow. If your milk flows slowly, you may need to continue this action during the feeding.  Support your breast with 4 fingers underneath and your thumb above your nipple (make the letter "C" with your hand). Make sure your fingers are well away from your nipple and your baby's mouth.  Stroke your baby's lips gently with your finger or nipple.  When your baby's mouth is open wide enough, quickly bring your baby to your breast, placing your entire nipple and as much of the areola as possible into your baby's mouth. The areola is the colored area around your nipple. ? More areola should be visible above your baby's upper lip than below the lower lip. ? Your baby's lips should be opened and extended outward (flanged) to ensure an adequate, comfortable latch. ? Your baby's tongue should be between his or her lower gum and your breast.  Make sure that your baby's mouth is correctly positioned around your nipple (latched). Your baby's lips should create a seal on your breast and be turned out (everted).  It is common for your baby to suck about 2-3 minutes in order to start the flow of breast milk. Latching Teaching your baby how to latch onto your breast properly   is very important. An improper latch can cause nipple pain, decreased milk supply, and poor weight gain in your baby. Also, if your baby is not latched onto your nipple properly, he or she may swallow some air during feeding. This can make your baby fussy. Burping your baby when you switch breasts  during the feeding can help to get rid of the air. However, teaching your baby to latch on properly is still the best way to prevent fussiness from swallowing air while breastfeeding. Signs that your baby has successfully latched onto your nipple  Silent tugging or silent sucking, without causing you pain. Infant's lips should be extended outward (flanged).  Swallowing heard between every 3-4 sucks once your milk has started to flow (after your let-down milk reflex occurs).  Muscle movement above and in front of his or her ears while sucking. Signs that your baby has not successfully latched onto your nipple  Sucking sounds or smacking sounds from your baby while breastfeeding.  Nipple pain. If you think your baby has not latched on correctly, slip your finger into the corner of your baby's mouth to break the suction and place it between your baby's gums. Attempt to start breastfeeding again. Signs of successful breastfeeding Signs from your baby  Your baby will gradually decrease the number of sucks or will completely stop sucking.  Your baby will fall asleep.  Your baby's body will relax.  Your baby will retain a small amount of milk in his or her mouth.  Your baby will let go of your breast by himself or herself. Signs from you  Breasts that have increased in firmness, weight, and size 1-3 hours after feeding.  Breasts that are softer immediately after breastfeeding.  Increased milk volume, as well as a change in milk consistency and color by the fifth day of breastfeeding.  Nipples that are not sore, cracked, or bleeding. Signs that your baby is getting enough milk  Wetting at least 1-2 diapers during the first 24 hours after birth.  Wetting at least 5-6 diapers every 24 hours for the first week after birth. The urine should be clear or pale yellow by the age of 5 days.  Wetting 6-8 diapers every 24 hours as your baby continues to grow and develop.  At least 3 stools in  a 24-hour period by the age of 5 days. The stool should be soft and yellow.  At least 3 stools in a 24-hour period by the age of 7 days. The stool should be seedy and yellow.  No loss of weight greater than 10% of birth weight during the first 3 days of life.  Average weight gain of 4-7 oz (113-198 g) per week after the age of 4 days.  Consistent daily weight gain by the age of 5 days, without weight loss after the age of 2 weeks. After a feeding, your baby may spit up a small amount of milk. This is normal. Breastfeeding frequency and duration Frequent feeding will help you make more milk and can prevent sore nipples and extremely full breasts (breast engorgement). Breastfeed when you feel the need to reduce the fullness of your breasts or when your baby shows signs of hunger. This is called "breastfeeding on demand." Signs that your baby is hungry include:  Increased alertness, activity, or restlessness.  Movement of the head from side to side.  Opening of the mouth when the corner of the mouth or cheek is stroked (rooting).  Increased sucking sounds, smacking lips,   cooing, sighing, or squeaking.  Hand-to-mouth movements and sucking on fingers or hands.  Fussing or crying. Avoid introducing a pacifier to your baby in the first 4-6 weeks after your baby is born. After this time, you may choose to use a pacifier. Research has shown that pacifier use during the first year of a baby's life decreases the risk of sudden infant death syndrome (SIDS). Allow your baby to feed on each breast as long as he or she wants. When your baby unlatches or falls asleep while feeding from the first breast, offer the second breast. Because newborns are often sleepy in the first few weeks of life, you may need to awaken your baby to get him or her to feed. Breastfeeding times will vary from baby to baby. However, the following rules can serve as a guide to help you make sure that your baby is properly  fed:  Newborns (babies 4 weeks of age or younger) may breastfeed every 1-3 hours.  Newborns should not go without breastfeeding for longer than 3 hours during the day or 5 hours during the night.  You should breastfeed your baby a minimum of 8 times in a 24-hour period. Breast milk pumping     Pumping and storing breast milk allows you to make sure that your baby is exclusively fed your breast milk, even at times when you are unable to breastfeed. This is especially important if you go back to work while you are still breastfeeding, or if you are not able to be present during feedings. Your lactation consultant can help you find a method of pumping that works best for you and give you guidelines about how long it is safe to store breast milk. Caring for your breasts while you breastfeed Nipples can become dry, cracked, and sore while breastfeeding. The following recommendations can help keep your breasts moisturized and healthy:  Avoid using soap on your nipples.  Wear a supportive bra designed especially for nursing. Avoid wearing underwire-style bras or extremely tight bras (sports bras).  Air-dry your nipples for 3-4 minutes after each feeding.  Use only cotton bra pads to absorb leaked breast milk. Leaking of breast milk between feedings is normal.  Use lanolin on your nipples after breastfeeding. Lanolin helps to maintain your skin's normal moisture barrier. Pure lanolin is not harmful (not toxic) to your baby. You may also hand express a few drops of breast milk and gently massage that milk into your nipples and allow the milk to air-dry. In the first few weeks after giving birth, some women experience breast engorgement. Engorgement can make your breasts feel heavy, warm, and tender to the touch. Engorgement peaks within 3-5 days after you give birth. The following recommendations can help to ease engorgement:  Completely empty your breasts while breastfeeding or pumping. You may  want to start by applying warm, moist heat (in the shower or with warm, water-soaked hand towels) just before feeding or pumping. This increases circulation and helps the milk flow. If your baby does not completely empty your breasts while breastfeeding, pump any extra milk after he or she is finished.  Apply ice packs to your breasts immediately after breastfeeding or pumping, unless this is too uncomfortable for you. To do this: ? Put ice in a plastic bag. ? Place a towel between your skin and the bag. ? Leave the ice on for 20 minutes, 2-3 times a day.  Make sure that your baby is latched on and positioned properly while breastfeeding.   If engorgement persists after 48 hours of following these recommendations, contact your health care provider or a Advertising copywriterlactation consultant. Overall health care recommendations while breastfeeding  Eat 3 healthy meals and 3 snacks every day. Well-nourished mothers who are breastfeeding need an additional 450-500 calories a day. You can meet this requirement by increasing the amount of a balanced diet that you eat.  Drink enough water to keep your urine pale yellow or clear.  Rest often, relax, and continue to take your prenatal vitamins to prevent fatigue, stress, and low vitamin and mineral levels in your body (nutrient deficiencies).  Do not use any products that contain nicotine or tobacco, such as cigarettes and e-cigarettes. Your baby may be harmed by chemicals from cigarettes that pass into breast milk and exposure to secondhand smoke. If you need help quitting, ask your health care provider.  Avoid alcohol.  Do not use illegal drugs or marijuana.  Talk with your health care provider before taking any medicines. These include over-the-counter and prescription medicines as well as vitamins and herbal supplements. Some medicines that may be harmful to your baby can pass through breast milk.  It is possible to become pregnant while breastfeeding. If birth  control is desired, ask your health care provider about options that will be safe while breastfeeding your baby. Where to find more information: [employer]: www.llli.org Contact a health care provider if:  You feel like you want to stop breastfeeding or have become frustrated with breastfeeding.  Your nipples are cracked or bleeding.  Your breasts are red, tender, or warm.  You have: ? Painful breasts or nipples. ? A swollen area on either breast. ? A fever or chills. ? Nausea or vomiting. ? Drainage other than breast milk from your nipples.  Your breasts do not become full before feedings by the fifth day after you give birth.  You feel sad and depressed.  Your baby is: ? Too sleepy to eat well. ? Having trouble sleeping. ? More than 751 week old and wetting fewer than 6 diapers in a 24-hour period. ? Not gaining weight by 135 days of age.  Your baby has fewer than 3 stools in a 24-hour period.  Your baby's skin or the white parts of his or her eyes become yellow. Get help right away if:  Your baby is overly tired (lethargic) and does not want to wake up and feed.  Your baby develops an unexplained fever. Summary  Breastfeeding offers many health benefits for infant and mothers.  Try to breastfeed your infant when he or she shows early signs of hunger.  Gently tickle or stroke your baby's lips with your finger or nipple to allow the baby to open his or her mouth. Bring the baby to your breast. Make sure that much of the areola is in your baby's mouth. Offer one side and burp the baby before you offer the other side.  Talk with your health care provider or lactation consultant if you have questions or you face problems as you breastfeed. This information is not intended to replace advice given to you by your health care provider. Make sure you discuss any questions you have with your health care provider. Document Released: 10/12/2005 Document Revised:  11/13/2016 Document Reviewed: 11/13/2016 Elsevier Interactive Patient Education  2019 [employer]  Vaginal Birth After Cesarean Delivery  Vaginal birth after cesarean delivery (VBAC) is giving birth vaginally after previously delivering a baby through a cesarean section (C-section). A VBAC may be  a safe option for you, depending on your health and other factors. It is important to discuss VBAC with your health care provider early in your pregnancy so you can understand the risks, benefits, and options. Having these discussions early will give you time to make your birth plan. Who are the best candidates for VBAC? The best candidates for VBAC are women who:  Have had one or two prior cesarean deliveries, and the incision made during the delivery was horizontal (low transverse).  Do not have a vertical (classical) scar on their uterus.  Have not had a tear in the wall of their uterus (uterine rupture).  Plan to have more pregnancies. A VBAC is also more likely to be successful:  In women who have previously given birth vaginally.  When labor starts by itself (spontaneously) before the due date. What are the benefits of VBAC? The benefits of delivering your baby vaginally instead of by a cesarean delivery include:  A shorter hospital stay.  A faster recovery time.  Less pain.  Avoiding risks associated with major surgery, such as infection and blood clots.  Less blood loss and less need for donated blood (transfusions). What are the risks of VBAC? The main risk of attempting a VBAC is that it may fail, forcing your health care provider to deliver your baby by a C-section. Other risks are rare and include:  Tearing (rupture) of the scar from a past cesarean delivery.  Other risks associated with vaginal deliveries. If a repeat cesarean delivery is needed, the risks include:  Blood loss.  Infection.  Blood clot.  Damage to surrounding organs.  Removal of the uterus  (hysterectomy), if it is damaged.  Placenta problems in future pregnancies. What else should I know about my options? Delivering a baby through a VBAC is similar to having a normal spontaneous vaginal delivery. Therefore, it is safe:  To try with twins.  For your health care provider to try to turn the baby from a breech position (external cephalic version) during labor.  With epidural analgesia for pain relief. Consider where you would like to deliver your baby. VBAC should be attempted in facilities where an emergency cesarean delivery can be performed. VBAC is not recommended for home births. Any changes in your health or your baby's health during your pregnancy may make it necessary to change your initial decision about VBAC. Your health care provider may recommend that you do not attempt a VBAC if:  Your baby's suspected weight is 8.8 lb (4 kg) or more.  You have preeclampsia. This is a condition that causes high blood pressure along with other symptoms, such as swelling and headaches.  You will have VBAC less than 19 months after your cesarean delivery.  You are past your due date.  You need to have labor started (induced) because your cervix is not ready for labor (unfavorable). Where to find more information  American Pregnancy Association: americanpregnancy.org  [employer]: acog.org Summary  Vaginal birth after cesarean delivery (VBAC) is giving birth vaginally after previously delivering a baby through a cesarean section (C-section). A VBAC may be a safe option for you, depending on your health and other factors.  Discuss VBAC with your health care provider early in your pregnancy so you can understand the risks, benefits, options, and have plenty of time to make your birth plan.  The main risk of attempting a VBAC is that it may fail, forcing your health care provider to deliver your  baby by a C-section. Other risks are  rare. This information is not intended to replace advice given to you by your health care provider. Make sure you discuss any questions you have with your health care provider. Document Released: 04/04/2007 Document Revised: 01/19/2017 Document Reviewed: 01/19/2017 Elsevier Interactive Patient Education  2019 [employer].

## 2018-12-12 NOTE — Progress Notes (Signed)
PRENATAL VISIT NOTE  Subjective:  Linda Keith is a 23 y.o. G2P1001 at [redacted]w[redacted]d being seen today for ongoing prenatal care.  She is currently monitored for the following issues for this low-risk pregnancy and has Chronic pain of left knee; Supervision of low-risk pregnancy; History of sexually transmitted disease; Fatigue; and Previous cesarean delivery affecting pregnancy, antepartum on their problem list.  Patient reports no complaints.  Contractions: Not present.  .  Movement: Present. Denies leaking of fluid.   The following portions of the patient's history were reviewed and updated as appropriate: allergies, current medications, past family history, past medical history, past social history, past surgical history and problem list. Problem list updated.  Objective:   Vitals:   12/12/18 1040  BP: 118/60  Pulse: 94  Temp: 98.6 F (37 C)  TempSrc: Oral  SpO2: 100%  Weight: 181 lb (82.1 kg)  Height: 5\' 3"  (1.6 m)    Fetal Status: Fetal Heart Rate (bpm): 136 Fundal Height: 32 cm Movement: Present     General:  Alert, oriented and cooperative. Patient is in no acute distress.  Skin: Skin is warm and dry. No rash noted.   Cardiovascular: Normal heart rate noted  Respiratory: Normal respiratory effort, no problems with respiration noted  Abdomen: Soft, gravid, appropriate for gestational age.  Pain/Pressure: Absent     Pelvic: Cervical exam deferred        Extremities: Normal range of motion.  Edema: None  Mental Status: Normal mood and affect. Normal behavior. Normal judgment and thought content.   Assessment and Plan:  Pregnancy: G2P1001 at [redacted]w[redacted]d  1. Prenatal care in third trimester Continue routine prenatal care.   2. Previous cesarean delivery affecting pregnancy, antepartum Risk of uterine rupture at term is 0.78 percent with TOLAC and 0.22 percent with ERCD. 1 in 10 uterine ruptures will result in neonatal death or neurological injury. The benefits of a trial of  labor after cesarean (TOLAC) resulting in a vaginal birth after cesarean (VBAC) include the following: shorter length of hospital stay and postpartum recovery (in most cases); fewer complications, such as postpartum fever, wound or uterine infection, thromboembolism (blood clots in the leg or lung), need for blood transfusion and fewer neonatal breathing problems. The risks of an attempted VBAC or TOLAC include the following: . Risk of failed trial of labor after cesarean (TOLAC) without a vaginal birth after cesarean (VBAC) resulting in repeat cesarean delivery (RCD) in about 20 to 40 percent of women who attempt VBAC.  Marland Kitchen Risk of rupture of uterus resulting in an emergency cesarean delivery. The risk of uterine rupture may be related in part to the type of uterine incision made during the first cesarean delivery. A previous transverse uterine incision has the lowest risk of rupture (0.2 to 1.5 percent risk). Vertical or T-shaped uterine incisions have a higher risk of uterine rupture (4 to 9 percent risk)The risk of fetal death is very low with both VBAC and elective repeat cesarean delivery (ERCD), but the likelihood of fetal death is higher with VBAC than with ERCD. Maternal death is very rare with either type of delivery. The risks of an elective repeat cesarean delivery (ERCD) were reviewed with the patient including but not limited to: 11/998 risk of uterine rupture which could have serious consequences, bleeding which may require transfusion; infection which may require antibiotics; injury to bowel, bladder or other surrounding organs (bowel, bladder, ureters); injury to the fetus; need for additional procedures including hysterectomy in the event of  a life-threatening hemorrhage; thromboembolic phenomenon; abnormal placentation; incisional problems; death and other postoperative or anesthesia complications.    These risks and benefits are summarized on the consent form, which was reviewed with the  patient during the visit.  All her questions answered and she signed a consent indicating a preference for TOLAC. A copy of the consent was scanned to the chart  Preterm labor symptoms and general obstetric precautions including but not limited to vaginal bleeding, contractions, leaking of fluid and fetal movement were reviewed in detail with the patient. Please refer to After Visit Summary for other counseling recommendations.  Return in 2 weeks (on 12/26/2018).  Future Appointments  Date Time Provider Department Center  12/30/2018  2:30 PM Lovena Neighbours, MD [facility]    Reva Bores, MD

## 2018-12-28 ENCOUNTER — Encounter (HOSPITAL_COMMUNITY): Payer: Self-pay | Admitting: *Deleted

## 2018-12-28 ENCOUNTER — Other Ambulatory Visit: Payer: Self-pay

## 2018-12-28 ENCOUNTER — Inpatient Hospital Stay (HOSPITAL_COMMUNITY)
Admission: AD | Admit: 2018-12-28 | Discharge: 2018-12-29 | Disposition: A | Discharge status: Home / Self Care | Payer: Medicaid Other | Attending: Obstetrics and Gynecology | Admitting: Obstetrics and Gynecology

## 2018-12-28 DIAGNOSIS — M545 Low back pain: Secondary | ICD-10-CM | POA: Diagnosis present

## 2018-12-28 DIAGNOSIS — O26893 Other specified pregnancy related conditions, third trimester: Secondary | ICD-10-CM | POA: Insufficient documentation

## 2018-12-28 DIAGNOSIS — Z3A33 33 weeks gestation of pregnancy: Secondary | ICD-10-CM

## 2018-12-28 DIAGNOSIS — R1030 Lower abdominal pain, unspecified: Secondary | ICD-10-CM | POA: Insufficient documentation

## 2018-12-28 DIAGNOSIS — O4703 False labor before 37 completed weeks of gestation, third trimester: Secondary | ICD-10-CM | POA: Diagnosis not present

## 2018-12-28 LAB — URINALYSIS, ROUTINE W REFLEX MICROSCOPIC
Bilirubin Urine: NEGATIVE
Glucose, UA: NEGATIVE mg/dL
Hgb urine dipstick: NEGATIVE
Ketones, ur: 80 mg/dL — AB
NITRITE: NEGATIVE
Protein, ur: NEGATIVE mg/dL
Specific Gravity, Urine: 1.03 (ref 1.005–1.030)
pH: 6 (ref 5.0–8.0)

## 2018-12-28 MED ORDER — LACTATED RINGERS IV SOLN
INTRAVENOUS | Status: DC
  Administered 2018-12-28: via INTRAVENOUS

## 2018-12-28 MED ORDER — NIFEDIPINE 10 MG PO CAPS
10.0000 mg | ORAL_CAPSULE | ORAL | Status: DC | PRN
  Administered 2018-12-28 – 2018-12-29 (×2): 10 mg via ORAL
  Filled 2018-12-28 (×2): qty 1

## 2018-12-28 NOTE — MAU Note (Signed)
Pt presents to MAU c/o back pain that is lower and radiates into her lower abdomen. Pt denies bleeding or LOF.+ FM, no ctx.

## 2018-12-28 NOTE — MAU Provider Note (Signed)
Chief Complaint:  Back Pain   First Provider Initiated Contact with Patient 12/28/18 2315      HPI: Linda Keith is a 23 y.o. G2P1001 at 11w6dwho presents to maternity admissions reporting uterine contractions, with pain in lower abdomen and lower back. . She reports good fetal movement, denies LOF, vaginal bleeding, vaginal itching/burning, urinary symptoms, h/a, dizziness, n/v, diarrhea, constipation or fever/chills.    RN note: Pt presents to MAU c/o back pain that is lower and radiates into her lower abdomen. Pt denies bleeding or LOF.+ FM, no ctx.   Past Medical History: Past Medical History:  Diagnosis Date  . Anemia   . Prenatal care 08/07/2014    Nursing Staff Provider Office Location  [facility] Dating  LMP:05/06/18 Language  English Anatomy US   normal Flu Vaccine  08/02/18 Genetic Screen  Quad: WNL  TDaP vaccine    Hgb A1C or  GTT Early  Third trimester 112 Rhogam  NA   LAB RESULTS  Feeding Plan  Blood Type O/Positive/-- (09/04 1459) O+ Contraception  Antibody Negative (09/04 1459)Negative Circumcision  yes Rubella 4.26 (09/04 1459) 4.26 immune    Past obstetric history: OB History  Gravida Para Term Preterm AB Living  2 1 1     1   SAB TAB Ectopic Multiple Live Births        0 1    # Outcome Date GA Lbr Len/2nd Weight Sex Delivery Anes PTL Lv  2 Current           1 Term 11/14/14 [redacted]w[redacted]d 19:54 / 03:37 3620 g M CS-LTranv EPI  LIV    Past Surgical History: Past Surgical History:  Procedure Laterality Date  . CESAREAN SECTION N/A 11/14/2014   Procedure: CESAREAN SECTION;  Surgeon: Catalina Antigua, MD;  Location: [facility];  Service: Obstetrics;  Laterality: N/A;  . NO PAST SURGERIES      Family History: Family History  Problem Relation Age of Onset  . Cardiomyopathy Mother   . Peripheral Artery Disease Mother     Social History: Social History   Tobacco Use  . Smoking status: Never Smoker  . Smokeless tobacco: Never Used  Substance Use Topics  . Alcohol use: No   Alcohol/week: 0.0 standard drinks  . Drug use: No    Allergies: No Known Allergies  Meds:  No medications prior to admission.    I have reviewed patient's Past Medical Hx, Surgical Hx, Family Hx, Social Hx, medications and allergies.   ROS:  Review of Systems  Constitutional: Negative for chills and fever.  Respiratory: Negative for shortness of breath.   Gastrointestinal: Negative for constipation, diarrhea and nausea.  Genitourinary: Positive for pelvic pain. Negative for vaginal bleeding and vaginal discharge.  Musculoskeletal: Positive for back pain.   Other systems negative  Physical Exam   Patient Vitals for the past 24 hrs:  BP Temp Temp src Pulse Resp Height Weight  12/29/18 0105 - 98.2 F (36.8 C) Oral 100 18 - -  12/29/18 0040 121/74 - - (!) 112 - - -  12/29/18 0021 (!) 116/50 - - (!) 101 - - -  12/29/18 0000 122/75 - - - - - -  12/28/18 2339 111/79 - - - - - -  12/28/18 2105 122/70 98.5 F (36.9 C) Oral 96 17 5\' 3"  (1.6 m) 85.1 kg   Constitutional: Well-developed, well-nourished female in no acute distress.  Cardiovascular: normal rate and rhythm Respiratory: normal effort, clear to auscultation bilaterally GI: Abd soft, non-tender, gravid  appropriate for gestational age.   No rebound or guarding. MS: Extremities nontender, no edema, normal ROM Neurologic: Alert and oriented x 4.  GU: Neg CVAT.  PELVIC EXAM: Fetal fibronectin collected Dilation: 1 Effacement (%): 70 Cervical Position: Middle Station: -3 Presentation: Vertex Exam by:: Mayford Knife CNM  FHT:  Baseline 140 , moderate variability, accelerations present, no decelerations Contractions: q 3 mins Irregular    Labs: Results for orders placed or performed during the hospital encounter of 12/28/18 (from the past 24 hour(s))  Urinalysis, Routine w reflex microscopic     Status: Abnormal   Collection Time: 12/28/18  9:11 PM  Result Value Ref Range   Color, Urine YELLOW YELLOW   APPearance HAZY  (A) CLEAR   Specific Gravity, Urine 1.030 1.005 - 1.030   pH 6.0 5.0 - 8.0   Glucose, UA NEGATIVE NEGATIVE mg/dL   Hgb urine dipstick NEGATIVE NEGATIVE   Bilirubin Urine NEGATIVE NEGATIVE   Ketones, ur 80 (A) NEGATIVE mg/dL   Protein, ur NEGATIVE NEGATIVE mg/dL   Nitrite NEGATIVE NEGATIVE   Leukocytes,Ua SMALL (A) NEGATIVE   RBC / HPF 0-5 0 - 5 RBC/hpf   WBC, UA 0-5 0 - 5 WBC/hpf   Bacteria, UA RARE (A) NONE SEEN   Squamous Epithelial / LPF 6-10 0 - 5   Mucus PRESENT   Fetal fibronectin     Status: None   Collection Time: 12/28/18 11:35 PM  Result Value Ref Range   Fetal Fibronectin NEGATIVE NEGATIVE   O/Positive/-- (09/04 1459)  Imaging:  No results found.  MAU Course/MDM: I have ordered labs and reviewed results. Fetal fibronectin and UA are negative NST reviewed, reactive IV fluids given  Procardia given x 2 doses with some reduction of contractions, but they remained q 4 min   BP too low to give more doses.  Terbutaline given x 1 with good resolution of contractions.  Only a few mild ones now.  Recheck of cervix was unchanged     Assessment: 1. Preterm uterine contractions in third trimester, antepartum   2.     Negative fetal fibronectin 3..    Cervix dilated 1cm but did not change, ?multiparous  Plan: Discharge home Preterm Labor precautions and fetal kick counts Follow up in Office for prenatal visits and recheck of status  Follow-up Information    [facility]. Schedule an appointment as soon as possible for a visit.   Contact information: [address] [place] [address] [phone]        Encouraged to return here or to other Urgent Care/ED if she develops worsening of symptoms, increase in pain, fever, or other concerning symptoms.   Pt stable at time of discharge.  Wynelle Bourgeois CNM, MSN Certified Nurse-Midwife 12/29/2018 4:28 AM

## 2018-12-29 LAB — FETAL FIBRONECTIN: FETAL FIBRONECTIN: NEGATIVE

## 2018-12-29 MED ORDER — TERBUTALINE SULFATE 1 MG/ML IJ SOLN
0.2500 mg | Freq: Once | INTRAMUSCULAR | Status: AC
  Administered 2018-12-29: 0.25 mg via SUBCUTANEOUS
  Filled 2018-12-29: qty 1

## 2018-12-29 NOTE — Discharge Instructions (Signed)
Preterm Labor and Birth Information ° °The normal length of a pregnancy is 39-41 weeks. Preterm labor is when labor starts before 37 completed weeks of pregnancy. °What are the risk factors for preterm labor? °Preterm labor is more likely to occur in women who: °· Have certain infections during pregnancy such as a bladder infection, sexually transmitted infection, or infection inside the uterus (chorioamnionitis). °· Have a shorter-than-normal cervix. °· Have gone into preterm labor before. °· Have had surgery on their cervix. °· Are younger than age 17 or older than age 35. °· Are African American. °· Are pregnant with twins or multiple babies (multiple gestation). °· Take street drugs or smoke while pregnant. °· Do not gain enough weight while pregnant. °· Became pregnant shortly after having been pregnant. °What are the symptoms of preterm labor? °Symptoms of preterm labor include: °· Cramps similar to those that can happen during a menstrual period. The cramps may happen with diarrhea. °· Pain in the abdomen or lower back. °· Regular uterine contractions that may feel like tightening of the abdomen. °· A feeling of increased pressure in the pelvis. °· Increased watery or bloody mucus discharge from the vagina. °· Water breaking (ruptured amniotic sac). °Why is it important to recognize signs of preterm labor? °It is important to recognize signs of preterm labor because babies who are born prematurely may not be fully developed. This can put them at an increased risk for: °· Long-term (chronic) heart and lung problems. °· Difficulty immediately after birth with regulating body systems, including blood sugar, body temperature, heart rate, and breathing rate. °· Bleeding in the brain. °· Cerebral palsy. °· Learning difficulties. °· Death. °These risks are highest for babies who are born before 34 weeks of pregnancy. °How is preterm labor treated? °Treatment depends on the length of your pregnancy, your condition,  and the health of your baby. It may involve: °· Having a stitch (suture) placed in your cervix to prevent your cervix from opening too early (cerclage). °· Taking or being given medicines, such as: °? Hormone medicines. These may be given early in pregnancy to help support the pregnancy. °? Medicine to stop contractions. °? Medicines to help mature the baby’s lungs. These may be prescribed if the risk of delivery is high. °? Medicines to prevent your baby from developing cerebral palsy. °If the labor happens before 34 weeks of pregnancy, you may need to stay in the hospital. °What should I do if I think I am in preterm labor? °If you think that you are going into preterm labor, call your health care provider right away. °How can I prevent preterm labor in future pregnancies? °To increase your chance of having a full-term pregnancy: °· Do not use any tobacco products, such as cigarettes, chewing tobacco, and e-cigarettes. If you need help quitting, ask your health care provider. °· Do not use street drugs or medicines that have not been prescribed to you during your pregnancy. °· Talk with your health care provider before taking any herbal supplements, even if you have been taking them regularly. °· Make sure you gain a healthy amount of weight during your pregnancy. °· Watch for infection. If you think that you might have an infection, get it checked right away. °· Make sure to tell your health care provider if you have gone into preterm labor before. °This information is not intended to replace advice given to you by your health care provider. Make sure you discuss any questions you have with your   health care provider. °Document Released: 01/02/2004 Document Revised: 03/24/2016 Document Reviewed: 03/04/2016 °Elsevier Interactive Patient Education © 2019 [employer] ° ° °Activity Restriction During Pregnancy °Your health care provider may recommend specific activity restrictions during pregnancy for a variety of  reasons. Activity restriction may require that you limit activities that require great effort, such as exercise, lifting, or sex. °The type of activity restriction will vary for each person, depending on your risk or the problems you are having. Activity restriction may be recommended for a period of time until your baby is delivered. °Why are activity restrictions recommended? °Activity restriction may be recommended if: °· Your placenta is partially or completely covering the opening of your cervix (placenta previa). °· There is bleeding between the wall of the uterus and the amniotic sac in the first trimester of pregnancy (subchorionic hemorrhage). °· You went into labor too early (preterm labor). °· You have a history of miscarriage. °· You have a condition that causes high blood pressure during pregnancy (preeclampsia or eclampsia). °· You are pregnant with more than one baby. °· Your baby is not growing well. °What are the risks? °The risks depend on your specific restriction. Strict bed rest has the most physical and emotional risks and is no longer routinely recommended. Risks of strict bed rest include: °· Loss of muscle conditioning from not moving. °· Blood clots. °· Social isolation. °· Depression. °· Loss of income. °Talk with your health care team about activity restriction to decide if it is best for you and your baby. Even if you are having problems during your pregnancy, you may be able to continue with normal levels of activity with careful monitoring by your health care team. °Follow these instructions at home: °If needed, based on your overall health and the health of your baby, your health care provider will decide which type of activity restriction is right for you. Activity restrictions may include: °· Not lifting anything heavier than 10 pounds (4.5 kg). °· Avoiding activities that take a lot of physical effort. °· No lifting or straining. °· Resting in a sitting position or lying down for  periods of time during the day. °Pelvic rest may be recommended along with activity restrictions. If pelvic rest is recommended, then: °· Do not have sex, an orgasm, or use sexual stimulation. °· Do not use tampons. Do not douche. Do not put anything into your vagina. °· Do not lift anything that is heavier than 10 lb (4.5 kg). °· Avoid activities that require a lot of effort. °· Avoid any activity in which your pelvic muscles could become strained, such as squatting. °Questions to ask your health care provider °· Why is my activity being limited? °· How will activity restrictions affect my body? °· Why is rest helpful for me and my baby? °· What activities can I do? °· When can I return to normal activities? °When should I seek immediate medical care? °Seek immediate medical care if you have: °· Vaginal bleeding. °· Vaginal discharge. °· Cramping pain in your lower abdomen. °· Regular contractions. °· A low, dull backache. °Summary °· Your health care provider may recommend specific activity restrictions during pregnancy for a variety of reasons. °· Activity restriction may require that you limit activities such as exercise, lifting, sex, or any other activity that requires great effort. °· Discuss the risks and benefits of activity restriction with your health care team to decide if it is best for you and your baby. °· Contact your health care   provider right away if you think you are having contractions, or if you notice vaginal bleeding, discharge, or cramping. This information is not intended to replace advice given to you by your health care provider. Make sure you discuss any questions you have with your health care provider. Document Released: 02/06/2011 Document Revised: 02/01/2018 Document Reviewed: 02/01/2018 Elsevier Interactive Patient Education  2019 [employer].

## 2018-12-29 NOTE — MAU Note (Signed)
Holding third dose of Procardia due to BP being low provider notified and stated she was going to order a different medication.

## 2018-12-30 ENCOUNTER — Ambulatory Visit (INDEPENDENT_AMBULATORY_CARE_PROVIDER_SITE_OTHER): Payer: Medicaid Other | Admitting: Family Medicine

## 2018-12-30 ENCOUNTER — Encounter: Payer: Self-pay | Admitting: Family Medicine

## 2018-12-30 ENCOUNTER — Other Ambulatory Visit: Payer: Self-pay

## 2018-12-30 VITALS — HR 98 | Temp 98.4°F | Wt 188.4 lb

## 2018-12-30 DIAGNOSIS — Z3493 Encounter for supervision of normal pregnancy, unspecified, third trimester: Secondary | ICD-10-CM

## 2018-12-30 NOTE — Patient Instructions (Signed)
Third Trimester of Pregnancy The third trimester is from week 28 through week 40 (months 7 through 9). The third trimester is a time when the unborn baby (fetus) is growing rapidly. At the end of the ninth month, the fetus is about 20 inches in length and weighs 6-10 pounds. Body changes during your third trimester Your body will continue to go through many changes during pregnancy. The changes vary from woman to woman. During the third trimester:  Your weight will continue to increase. You can expect to gain 25-35 pounds (11-16 kg) by the end of the pregnancy.  You may begin to get stretch marks on your hips, abdomen, and breasts.  You may urinate more often because the fetus is moving lower into your pelvis and pressing on your bladder.  You may develop or continue to have heartburn. This is caused by increased hormones that slow down muscles in the digestive tract.  You may develop or continue to have constipation because increased hormones slow digestion and cause the muscles that push waste through your intestines to relax.  You may develop hemorrhoids. These are swollen veins (varicose veins) in the rectum that can itch or be painful.  You may develop swollen, bulging veins (varicose veins) in your legs.  You may have increased body aches in the pelvis, back, or thighs. This is due to weight gain and increased hormones that are relaxing your joints.  You may have changes in your hair. These can include thickening of your hair, rapid growth, and changes in texture. Some women also have hair loss during or after pregnancy, or hair that feels dry or thin. Your hair will most likely return to normal after your baby is born.  Your breasts will continue to grow and they will continue to become tender. A yellow fluid (colostrum) may leak from your breasts. This is the first milk you are producing for your baby.  Your belly button may stick out.  You may notice more swelling in your hands,  face, or ankles.  You may have increased tingling or numbness in your hands, arms, and legs. The skin on your belly may also feel numb.  You may feel short of breath because of your expanding uterus.  You may have more problems sleeping. This can be caused by the size of your belly, increased need to urinate, and an increase in your body's metabolism.  You may notice the fetus "dropping," or moving lower in your abdomen (lightening).  You may have increased vaginal discharge.  You may notice your joints feel loose and you may have pain around your pelvic bone. What to expect at prenatal visits You will have prenatal exams every 2 weeks until week 36. Then you will have weekly prenatal exams. During a routine prenatal visit:  You will be weighed to make sure you and the baby are growing normally.  Your blood pressure will be taken.  Your abdomen will be measured to track your baby's growth.  The fetal heartbeat will be listened to.  Any test results from the previous visit will be discussed.  You may have a cervical check near your due date to see if your cervix has softened or thinned (effaced).  You will be tested for Group B streptococcus. This happens between 35 and 37 weeks. Your health care provider may ask you:  What your birth plan is.  How you are feeling.  If you are feeling the baby move.  If you have had any abnormal   symptoms, such as leaking fluid, bleeding, severe headaches, or abdominal cramping.  If you are using any tobacco products, including cigarettes, chewing tobacco, and electronic cigarettes.  If you have any questions. Other tests or screenings that may be performed during your third trimester include:  Blood tests that check for low iron levels (anemia).  Fetal testing to check the health, activity level, and growth of the fetus. Testing is done if you have certain medical conditions or if there are problems during the pregnancy.  Nonstress test  (NST). This test checks the health of your baby to make sure there are no signs of problems, such as the baby not getting enough oxygen. During this test, a belt is placed around your belly. The baby is made to move, and its heart rate is monitored during movement. What is false labor? False labor is a condition in which you feel small, irregular tightenings of the muscles in the womb (contractions) that usually go away with rest, changing position, or drinking water. These are called Braxton Hicks contractions. Contractions may last for hours, days, or even weeks before true labor sets in. If contractions come at regular intervals, become more frequent, increase in intensity, or become painful, you should see your health care provider. What are the signs of labor?  Abdominal cramps.  Regular contractions that start at 10 minutes apart and become stronger and more frequent with time.  Contractions that start on the top of the uterus and spread down to the lower abdomen and back.  Increased pelvic pressure and dull back pain.  A watery or bloody mucus discharge that comes from the vagina.  Leaking of amniotic fluid. This is also known as your "water breaking." It could be a slow trickle or a gush. Let your health care provider know if it has a color or strange odor. If you have any of these signs, call your health care provider right away, even if it is before your due date. Follow these instructions at home: Medicines  Follow your health care provider's instructions regarding medicine use. Specific medicines may be either safe or unsafe to take during pregnancy.  Take a prenatal vitamin that contains at least 600 micrograms (mcg) of folic acid.  If you develop constipation, try taking a stool softener if your health care provider approves. Eating and drinking   Eat a balanced diet that includes fresh fruits and vegetables, whole grains, good sources of protein such as meat, eggs, or tofu,  and low-fat dairy. Your health care provider will help you determine the amount of weight gain that is right for you.  Avoid raw meat and uncooked cheese. These carry germs that can cause birth defects in the baby.  If you have low calcium intake from food, talk to your health care provider about whether you should take a daily calcium supplement.  Eat four or five small meals rather than three large meals a day.  Limit foods that are high in fat and processed sugars, such as fried and sweet foods.  To prevent constipation: ? Drink enough fluid to keep your urine clear or pale yellow. ? Eat foods that are high in fiber, such as fresh fruits and vegetables, whole grains, and beans. Activity  Exercise only as directed by your health care provider. Most women can continue their usual exercise routine during pregnancy. Try to exercise for 30 minutes at least 5 days a week. Stop exercising if you experience uterine contractions.  Avoid heavy lifting.  Do   not exercise in extreme heat or humidity, or at high altitudes.  Wear low-heel, comfortable shoes.  Practice good posture.  You may continue to have sex unless your health care provider tells you otherwise. Relieving pain and discomfort  Take frequent breaks and rest with your legs elevated if you have leg cramps or low back pain.  Take warm sitz baths to soothe any pain or discomfort caused by hemorrhoids. Use hemorrhoid cream if your health care provider approves.  Wear a good support bra to prevent discomfort from breast tenderness.  If you develop varicose veins: ? Wear support pantyhose or compression stockings as told by your healthcare provider. ? Elevate your feet for 15 minutes, 3-4 times a day. Prenatal care  Write down your questions. Take them to your prenatal visits.  Keep all your prenatal visits as told by your health care provider. This is important. Safety  Wear your seat belt at all times when driving.  Make  a list of emergency phone numbers, including numbers for family, friends, the hospital, and police and fire departments. General instructions  Avoid cat litter boxes and soil used by cats. These carry germs that can cause birth defects in the baby. If you have a cat, ask someone to clean the litter box for you.  Do not travel far distances unless it is absolutely necessary and only with the approval of your health care provider.  Do not use hot tubs, steam rooms, or saunas.  Do not drink alcohol.  Do not use any products that contain nicotine or tobacco, such as cigarettes and e-cigarettes. If you need help quitting, ask your health care provider.  Do not use any medicinal herbs or unprescribed drugs. These chemicals affect the formation and growth of the baby.  Do not douche or use tampons or scented sanitary pads.  Do not cross your legs for long periods of time.  To prepare for the arrival of your baby: ? Take prenatal classes to understand, practice, and ask questions about labor and delivery. ? Make a trial run to the hospital. ? Visit the hospital and tour the maternity area. ? Arrange for maternity or paternity leave through employers. ? Arrange for family and friends to take care of pets while you are in the hospital. ? Purchase a rear-facing car seat and make sure you know how to install it in your car. ? Pack your hospital bag. ? Prepare the baby's nursery. Make sure to remove all pillows and stuffed animals from the baby's crib to prevent suffocation.  Visit your dentist if you have not gone during your pregnancy. Use a soft toothbrush to brush your teeth and be gentle when you floss. Contact a health care provider if:  You are unsure if you are in labor or if your water has broken.  You become dizzy.  You have mild pelvic cramps, pelvic pressure, or nagging pain in your abdominal area.  You have lower back pain.  You have persistent nausea, vomiting, or  diarrhea.  You have an unusual or bad smelling vaginal discharge.  You have pain when you urinate. Get help right away if:  Your water breaks before 37 weeks.  You have regular contractions less than 5 minutes apart before 37 weeks.  You have a fever.  You are leaking fluid from your vagina.  You have spotting or bleeding from your vagina.  You have severe abdominal pain or cramping.  You have rapid weight loss or weight gain.  You have   shortness of breath with chest pain.  You notice sudden or extreme swelling of your face, hands, ankles, feet, or legs.  Your baby makes fewer than 10 movements in 2 hours.  You have severe headaches that do not go away when you take medicine.  You have vision changes. Summary  The third trimester is from week 28 through week 40, months 7 through 9. The third trimester is a time when the unborn baby (fetus) is growing rapidly.  During the third trimester, your discomfort may increase as you and your baby continue to gain weight. You may have abdominal, leg, and back pain, sleeping problems, and an increased need to urinate.  During the third trimester your breasts will keep growing and they will continue to become tender. A yellow fluid (colostrum) may leak from your breasts. This is the first milk you are producing for your baby.  False labor is a condition in which you feel small, irregular tightenings of the muscles in the womb (contractions) that eventually go away. These are called Braxton Hicks contractions. Contractions may last for hours, days, or even weeks before true labor sets in.  Signs of labor can include: abdominal cramps; regular contractions that start at 10 minutes apart and become stronger and more frequent with time; watery or bloody mucus discharge that comes from the vagina; increased pelvic pressure and dull back pain; and leaking of amniotic fluid. This information is not intended to replace advice given to you by your  health care provider. Make sure you discuss any questions you have with your health care provider. Document Released: 10/06/2001 Document Revised: 11/17/2016 Document Reviewed: 11/17/2016 Elsevier Interactive Patient Education  2019 [employer]  

## 2018-12-30 NOTE — Progress Notes (Signed)
Linda Keith is a 24 y.o. G2P1001 at [redacted]w[redacted]d here for routine follow up.  She reports good fetal movement and denies any leaking, bleeding or contractions.  Patient was seen in the MAU on 3/4 for contraction and received 2 dose of Procardia and 1 dose of terbutaline with significant improvement in abdominal pain and contractions.  Patient was also recently seen by Dr. Shawnie Pons to discuss TOLAC.  Patient reports that she was told that she can try to have her next baby vaginally. See flow sheet for details.  A/P: Pregnancy at [redacted]w[redacted]d.  Doing well.   Pregnancy issues include: Intermittent back pain, recent admission to MAU (3/3) for contractions requiring Procardia and terbutaline.  Infant feeding choice: Breast and bottle Contraception choice: Consider Nexplanon Infant circumcision desired: yes  Tdap was not given today. Patient has been scheduled for OB clinic. Patient will be due for a GBS, gonorrhea and chlamydia at next office visit. Patient has been out of work for the past 2 days and will reassess readiness to return if unable we will write letter to keep her out of work longer.   Preterm labor and fetal movement precautions reviewed. Safe sleep discussed. Follow up 2 weeks.

## 2019-01-13 ENCOUNTER — Encounter: Payer: Self-pay | Admitting: Family Medicine

## 2019-01-13 ENCOUNTER — Other Ambulatory Visit: Payer: Self-pay

## 2019-01-13 ENCOUNTER — Ambulatory Visit (INDEPENDENT_AMBULATORY_CARE_PROVIDER_SITE_OTHER): Payer: Medicaid Other | Admitting: Family Medicine

## 2019-01-13 VITALS — BP 120/64 | HR 94 | Temp 98.6°F | Wt 191.0 lb

## 2019-01-13 DIAGNOSIS — Z3403 Encounter for supervision of normal first pregnancy, third trimester: Secondary | ICD-10-CM

## 2019-01-13 NOTE — Patient Instructions (Signed)
Third Trimester of Pregnancy The third trimester is from week 28 through week 40 (months 7 through 9). The third trimester is a time when the unborn baby (fetus) is growing rapidly. At the end of the ninth month, the fetus is about 20 inches in length and weighs 6-10 pounds. Body changes during your third trimester Your body will continue to go through many changes during pregnancy. The changes vary from woman to woman. During the third trimester:  Your weight will continue to increase. You can expect to gain 25-35 pounds (11-16 kg) by the end of the pregnancy.  You may begin to get stretch marks on your hips, abdomen, and breasts.  You may urinate more often because the fetus is moving lower into your pelvis and pressing on your bladder.  You may develop or continue to have heartburn. This is caused by increased hormones that slow down muscles in the digestive tract.  You may develop or continue to have constipation because increased hormones slow digestion and cause the muscles that push waste through your intestines to relax.  You may develop hemorrhoids. These are swollen veins (varicose veins) in the rectum that can itch or be painful.  You may develop swollen, bulging veins (varicose veins) in your legs.  You may have increased body aches in the pelvis, back, or thighs. This is due to weight gain and increased hormones that are relaxing your joints.  You may have changes in your hair. These can include thickening of your hair, rapid growth, and changes in texture. Some women also have hair loss during or after pregnancy, or hair that feels dry or thin. Your hair will most likely return to normal after your baby is born.  Your breasts will continue to grow and they will continue to become tender. A yellow fluid (colostrum) may leak from your breasts. This is the first milk you are producing for your baby.  Your belly button may stick out.  You may notice more swelling in your hands,  face, or ankles.  You may have increased tingling or numbness in your hands, arms, and legs. The skin on your belly may also feel numb.  You may feel short of breath because of your expanding uterus.  You may have more problems sleeping. This can be caused by the size of your belly, increased need to urinate, and an increase in your body's metabolism.  You may notice the fetus "dropping," or moving lower in your abdomen (lightening).  You may have increased vaginal discharge.  You may notice your joints feel loose and you may have pain around your pelvic bone. What to expect at prenatal visits You will have prenatal exams every 2 weeks until week 36. Then you will have weekly prenatal exams. During a routine prenatal visit:  You will be weighed to make sure you and the baby are growing normally.  Your blood pressure will be taken.  Your abdomen will be measured to track your baby's growth.  The fetal heartbeat will be listened to.  Any test results from the previous visit will be discussed.  You may have a cervical check near your due date to see if your cervix has softened or thinned (effaced).  You will be tested for Group B streptococcus. This happens between 35 and 37 weeks. Your health care provider may ask you:  What your birth plan is.  How you are feeling.  If you are feeling the baby move.  If you have had any abnormal   symptoms, such as leaking fluid, bleeding, severe headaches, or abdominal cramping.  If you are using any tobacco products, including cigarettes, chewing tobacco, and electronic cigarettes.  If you have any questions. Other tests or screenings that may be performed during your third trimester include:  Blood tests that check for low iron levels (anemia).  Fetal testing to check the health, activity level, and growth of the fetus. Testing is done if you have certain medical conditions or if there are problems during the pregnancy.  Nonstress test  (NST). This test checks the health of your baby to make sure there are no signs of problems, such as the baby not getting enough oxygen. During this test, a belt is placed around your belly. The baby is made to move, and its heart rate is monitored during movement. What is false labor? False labor is a condition in which you feel small, irregular tightenings of the muscles in the womb (contractions) that usually go away with rest, changing position, or drinking water. These are called Braxton Hicks contractions. Contractions may last for hours, days, or even weeks before true labor sets in. If contractions come at regular intervals, become more frequent, increase in intensity, or become painful, you should see your health care provider. What are the signs of labor?  Abdominal cramps.  Regular contractions that start at 10 minutes apart and become stronger and more frequent with time.  Contractions that start on the top of the uterus and spread down to the lower abdomen and back.  Increased pelvic pressure and dull back pain.  A watery or bloody mucus discharge that comes from the vagina.  Leaking of amniotic fluid. This is also known as your "water breaking." It could be a slow trickle or a gush. Let your health care provider know if it has a color or strange odor. If you have any of these signs, call your health care provider right away, even if it is before your due date. Follow these instructions at home: Medicines  Follow your health care provider's instructions regarding medicine use. Specific medicines may be either safe or unsafe to take during pregnancy.  Take a prenatal vitamin that contains at least 600 micrograms (mcg) of folic acid.  If you develop constipation, try taking a stool softener if your health care provider approves. Eating and drinking   Eat a balanced diet that includes fresh fruits and vegetables, whole grains, good sources of protein such as meat, eggs, or tofu,  and low-fat dairy. Your health care provider will help you determine the amount of weight gain that is right for you.  Avoid raw meat and uncooked cheese. These carry germs that can cause birth defects in the baby.  If you have low calcium intake from food, talk to your health care provider about whether you should take a daily calcium supplement.  Eat four or five small meals rather than three large meals a day.  Limit foods that are high in fat and processed sugars, such as fried and sweet foods.  To prevent constipation: ? Drink enough fluid to keep your urine clear or pale yellow. ? Eat foods that are high in fiber, such as fresh fruits and vegetables, whole grains, and beans. Activity  Exercise only as directed by your health care provider. Most women can continue their usual exercise routine during pregnancy. Try to exercise for 30 minutes at least 5 days a week. Stop exercising if you experience uterine contractions.  Avoid heavy lifting.  Do   not exercise in extreme heat or humidity, or at high altitudes.  Wear low-heel, comfortable shoes.  Practice good posture.  You may continue to have sex unless your health care provider tells you otherwise. Relieving pain and discomfort  Take frequent breaks and rest with your legs elevated if you have leg cramps or low back pain.  Take warm sitz baths to soothe any pain or discomfort caused by hemorrhoids. Use hemorrhoid cream if your health care provider approves.  Wear a good support bra to prevent discomfort from breast tenderness.  If you develop varicose veins: ? Wear support pantyhose or compression stockings as told by your healthcare provider. ? Elevate your feet for 15 minutes, 3-4 times a day. Prenatal care  Write down your questions. Take them to your prenatal visits.  Keep all your prenatal visits as told by your health care provider. This is important. Safety  Wear your seat belt at all times when driving.  Make  a list of emergency phone numbers, including numbers for family, friends, the hospital, and police and fire departments. General instructions  Avoid cat litter boxes and soil used by cats. These carry germs that can cause birth defects in the baby. If you have a cat, ask someone to clean the litter box for you.  Do not travel far distances unless it is absolutely necessary and only with the approval of your health care provider.  Do not use hot tubs, steam rooms, or saunas.  Do not drink alcohol.  Do not use any products that contain nicotine or tobacco, such as cigarettes and e-cigarettes. If you need help quitting, ask your health care provider.  Do not use any medicinal herbs or unprescribed drugs. These chemicals affect the formation and growth of the baby.  Do not douche or use tampons or scented sanitary pads.  Do not cross your legs for long periods of time.  To prepare for the arrival of your baby: ? Take prenatal classes to understand, practice, and ask questions about labor and delivery. ? Make a trial run to the hospital. ? Visit the hospital and tour the maternity area. ? Arrange for maternity or paternity leave through employers. ? Arrange for family and friends to take care of pets while you are in the hospital. ? Purchase a rear-facing car seat and make sure you know how to install it in your car. ? Pack your hospital bag. ? Prepare the baby's nursery. Make sure to remove all pillows and stuffed animals from the baby's crib to prevent suffocation.  Visit your dentist if you have not gone during your pregnancy. Use a soft toothbrush to brush your teeth and be gentle when you floss. Contact a health care provider if:  You are unsure if you are in labor or if your water has broken.  You become dizzy.  You have mild pelvic cramps, pelvic pressure, or nagging pain in your abdominal area.  You have lower back pain.  You have persistent nausea, vomiting, or  diarrhea.  You have an unusual or bad smelling vaginal discharge.  You have pain when you urinate. Get help right away if:  Your water breaks before 37 weeks.  You have regular contractions less than 5 minutes apart before 37 weeks.  You have a fever.  You are leaking fluid from your vagina.  You have spotting or bleeding from your vagina.  You have severe abdominal pain or cramping.  You have rapid weight loss or weight gain.  You have   shortness of breath with chest pain.  You notice sudden or extreme swelling of your face, hands, ankles, feet, or legs.  Your baby makes fewer than 10 movements in 2 hours.  You have severe headaches that do not go away when you take medicine.  You have vision changes. Summary  The third trimester is from week 28 through week 40, months 7 through 9. The third trimester is a time when the unborn baby (fetus) is growing rapidly.  During the third trimester, your discomfort may increase as you and your baby continue to gain weight. You may have abdominal, leg, and back pain, sleeping problems, and an increased need to urinate.  During the third trimester your breasts will keep growing and they will continue to become tender. A yellow fluid (colostrum) may leak from your breasts. This is the first milk you are producing for your baby.  False labor is a condition in which you feel small, irregular tightenings of the muscles in the womb (contractions) that eventually go away. These are called Braxton Hicks contractions. Contractions may last for hours, days, or even weeks before true labor sets in.  Signs of labor can include: abdominal cramps; regular contractions that start at 10 minutes apart and become stronger and more frequent with time; watery or bloody mucus discharge that comes from the vagina; increased pelvic pressure and dull back pain; and leaking of amniotic fluid. This information is not intended to replace advice given to you by your  health care provider. Make sure you discuss any questions you have with your health care provider. Document Released: 10/06/2001 Document Revised: 11/17/2016 Document Reviewed: 11/17/2016 Elsevier Interactive Patient Education  2019 [employer]  

## 2019-01-13 NOTE — Progress Notes (Signed)
Linda Keith is a 23 y.o. G2P1001 at [redacted]w[redacted]d here for routine follow up.  She reports she reports good fetal movement and denies any leaking, bleeding or contractions. See flow sheet for details.  A/P: Pregnancy at [redacted]w[redacted]d.  Doing well.   Pregnancy issues includeIntermittent back pain, recent admission to MAU (3/3) for contractions requiring Procardia and terbutaline. .  Infant feeding choice: Breast and Bottle  Contraception choice: Considering Nexplanon  Infant circumcision desired: yes  Tdap was not given today. GBS and gc/chlamydia testing was not performed today. Patient was with her son who did not leave the room. She has been rescheduled for 01/15/2018 @11 :00 am for GBS, GC/Ch.   Preterm labor and fetal movement precautions reviewed. Safe sleep discussed. Follow up 2 week(s).

## 2019-01-16 ENCOUNTER — Other Ambulatory Visit: Payer: Self-pay

## 2019-01-16 ENCOUNTER — Ambulatory Visit (INDEPENDENT_AMBULATORY_CARE_PROVIDER_SITE_OTHER): Payer: Medicaid Other | Admitting: Family Medicine

## 2019-01-16 ENCOUNTER — Other Ambulatory Visit (HOSPITAL_COMMUNITY)
Admission: RE | Admit: 2019-01-16 | Discharge: 2019-01-16 | Disposition: A | Discharge status: Home / Self Care | Payer: Medicaid Other | Source: Ambulatory Visit | Attending: Family Medicine | Admitting: Family Medicine

## 2019-01-16 VITALS — BP 112/70 | HR 90 | Wt 195.0 lb

## 2019-01-16 DIAGNOSIS — Z3493 Encounter for supervision of normal pregnancy, unspecified, third trimester: Secondary | ICD-10-CM | POA: Insufficient documentation

## 2019-01-16 DIAGNOSIS — Z3A00 Weeks of gestation of pregnancy not specified: Secondary | ICD-10-CM

## 2019-01-16 NOTE — Progress Notes (Signed)
   Subjective:    Patient ID: Linda Keith, female    DOB: 1995-12-29, 23 y.o.   MRN: [id number]   CC: Collection of GBS, GC/CH  HPI: Patient is 23 yo female G2P001 at [redacted]w[redacted]d who was seen for her routine prenatal visit on Friday. We were not able to collect GBS, GC/Ch during that visit due to son being present and unwilling to leave room. She is here today for collection.  Smoking status reviewed   ROS: all other systems were reviewed and are negative other than in the HPI   Past Medical History:  Diagnosis Date  . Anemia   . Prenatal care 08/07/2014    Nursing Staff Provider Office Location  [facility] Dating  LMP:05/06/18 Language  English Anatomy US   normal Flu Vaccine  08/02/18 Genetic Screen  Quad: WNL  TDaP vaccine    Hgb A1C or  GTT Early  Third trimester 112 Rhogam  NA   LAB RESULTS  Feeding Plan  Blood Type O/Positive/-- (09/04 1459) O+ Contraception  Antibody Negative (09/04 1459)Negative Circumcision  yes Rubella 4.26 (09/04 1459) 4.26 immune    Past Surgical History:  Procedure Laterality Date  . CESAREAN SECTION N/A 11/14/2014   Procedure: CESAREAN SECTION;  Surgeon: Catalina Antigua, MD;  Location: [facility];  Service: Obstetrics;  Laterality: N/A;  . NO PAST SURGERIES      Past medical history, surgical, family, and social history reviewed and updated in the EMR as appropriate.  Objective:  BP 112/70   Pulse 90   Wt 195 lb (88.5 kg)   LMP 05/06/2018 (Exact Date)   BMI 34.54 kg/m   Vitals and nursing note reviewed  General: NAD, pleasant, able to participate in exam Cardiac: RRR, normal heart sounds, no murmurs. 2+ radial and PT pulses bilaterally Respiratory: CTAB, normal effort, No wheezes, rales or rhonchi Abdomen: soft, nontender, nondistended, no hepatic or splenomegaly, +BS Extremities: no edema or cyanosis. WWP. Female genitalia: normal external genitalia, vulva, vagina, cervix, uterus and adnexa Cervix: not indicated and cervical discharge present - white  Skin: warm and dry, no rashes noted Neuro: alert and oriented x4, no focal deficits Psych: Normal affect and mood   Assessment & Plan:   Supervision of low-risk pregnancy GBS, Gonorrhea and Chlamydia collected today. Suspect possible BV given discharge noted. Will also send for a wet prep. Patient is scheduled for her next visit in two weeks.    Lovena Neighbours, MD [facility] PGY-3

## 2019-01-16 NOTE — Assessment & Plan Note (Signed)
GBS, Gonorrhea and Chlamydia collected today. Suspect possible BV given discharge noted. Will also send for a wet prep. Patient is scheduled for her next visit in two weeks.

## 2019-01-17 ENCOUNTER — Other Ambulatory Visit: Payer: Self-pay | Admitting: Family Medicine

## 2019-01-17 DIAGNOSIS — B9689 Other specified bacterial agents as the cause of diseases classified elsewhere: Secondary | ICD-10-CM

## 2019-01-17 DIAGNOSIS — N760 Acute vaginitis: Principal | ICD-10-CM

## 2019-01-17 LAB — CERVICOVAGINAL ANCILLARY ONLY
BACTERIAL VAGINITIS: POSITIVE — AB
Candida vaginitis: NEGATIVE
Chlamydia: NEGATIVE
Neisseria Gonorrhea: NEGATIVE
Trichomonas: NEGATIVE

## 2019-01-17 MED ORDER — METRONIDAZOLE 500 MG PO TABS: 500.0000 mg | ORAL_TABLET | Freq: Two times a day (BID) | ORAL | 0 refills | Status: DC

## 2019-01-18 ENCOUNTER — Encounter: Payer: Self-pay | Admitting: *Deleted

## 2019-01-20 LAB — CULTURE, BETA STREP (GROUP B ONLY): Strep Gp B Culture: NEGATIVE

## 2019-01-22 ENCOUNTER — Inpatient Hospital Stay (HOSPITAL_COMMUNITY): Payer: Medicaid Other | Admitting: Anesthesiology

## 2019-01-22 ENCOUNTER — Encounter (HOSPITAL_COMMUNITY): Payer: Self-pay | Admitting: *Deleted

## 2019-01-22 ENCOUNTER — Other Ambulatory Visit: Payer: Self-pay

## 2019-01-22 ENCOUNTER — Inpatient Hospital Stay (HOSPITAL_COMMUNITY)
Admission: AD | Admit: 2019-01-22 | Discharge: 2019-01-24 | DRG: 807 | Disposition: A | Discharge status: Home / Self Care | Payer: Medicaid Other | Attending: Obstetrics and Gynecology | Admitting: Obstetrics and Gynecology

## 2019-01-22 DIAGNOSIS — O34219 Maternal care for unspecified type scar from previous cesarean delivery: Secondary | ICD-10-CM | POA: Diagnosis present

## 2019-01-22 DIAGNOSIS — Z9189 Other specified personal risk factors, not elsewhere classified: Secondary | ICD-10-CM

## 2019-01-22 DIAGNOSIS — O4593 Premature separation of placenta, unspecified, third trimester: Secondary | ICD-10-CM | POA: Diagnosis present

## 2019-01-22 DIAGNOSIS — O479 False labor, unspecified: Secondary | ICD-10-CM | POA: Diagnosis present

## 2019-01-22 DIAGNOSIS — Z3A37 37 weeks gestation of pregnancy: Secondary | ICD-10-CM | POA: Diagnosis not present

## 2019-01-22 DIAGNOSIS — O26893 Other specified pregnancy related conditions, third trimester: Secondary | ICD-10-CM | POA: Diagnosis present

## 2019-01-22 LAB — COMPREHENSIVE METABOLIC PANEL
ALT: 15 U/L (ref 0–44)
AST: 20 U/L (ref 15–41)
Albumin: 3.1 g/dL — ABNORMAL LOW (ref 3.5–5.0)
Alkaline Phosphatase: 106 U/L (ref 38–126)
Anion gap: 7 (ref 5–15)
BUN: 14 mg/dL (ref 6–20)
CO2: 18 mmol/L — ABNORMAL LOW (ref 22–32)
Calcium: 8.9 mg/dL (ref 8.9–10.3)
Chloride: 109 mmol/L (ref 98–111)
Creatinine, Ser: 0.67 mg/dL (ref 0.44–1.00)
GFR calc Af Amer: >60 mL/min (ref >60)
GFR calc non Af Amer: >60 mL/min (ref >60)
Glucose, Bld: 89 mg/dL (ref 70–99)
Potassium: 3.9 mmol/L (ref 3.5–5.1)
Sodium: 134 mmol/L — ABNORMAL LOW (ref 135–145)
Total Bilirubin: 0.9 mg/dL (ref 0.3–1.2)
Total Protein: 6.4 g/dL — ABNORMAL LOW (ref 6.5–8.1)

## 2019-01-22 LAB — CBC
HEMATOCRIT: 36.9 % (ref 36.0–46.0)
Hemoglobin: 11.2 g/dL — ABNORMAL LOW (ref 12.0–15.0)
MCH: 23.3 pg — ABNORMAL LOW (ref 26.0–34.0)
MCHC: 30.4 g/dL (ref 30.0–36.0)
MCV: 76.7 fL — ABNORMAL LOW (ref 80.0–100.0)
Platelets: 181 10*3/uL (ref 150–400)
RBC: 4.81 MIL/uL (ref 3.87–5.11)
RDW: 15 % (ref 11.5–15.5)
WBC: 4.9 10*3/uL (ref 4.0–10.5)
nRBC: 0 % (ref 0.0–0.2)

## 2019-01-22 LAB — TYPE AND SCREEN
ABO/RH(D): O POS
Antibody Screen: NEGATIVE

## 2019-01-22 LAB — SAVE SMEAR(SSMR), FOR PROVIDER SLIDE REVIEW

## 2019-01-22 LAB — KLEIHAUER-BETKE STAIN
# Vials RhIg: 1
Fetal Cells %: 0 %
Quantitation Fetal Hemoglobin: 0 mL

## 2019-01-22 LAB — PROTIME-INR
INR: 1 (ref 0.8–1.2)
Prothrombin Time: 13.3 seconds (ref 11.4–15.2)

## 2019-01-22 LAB — FIBRINOGEN: Fibrinogen: 517 mg/dL — ABNORMAL HIGH (ref 210–475)

## 2019-01-22 LAB — APTT: APTT: 26 s (ref 24–36)

## 2019-01-22 LAB — ABO/RH: ABO/RH(D): O POS

## 2019-01-22 MED ORDER — OXYCODONE-ACETAMINOPHEN 5-325 MG PO TABS: 2.0000 | ORAL_TABLET | ORAL | Status: DC | PRN

## 2019-01-22 MED ORDER — DIPHENHYDRAMINE HCL 50 MG/ML IJ SOLN: 12.5000 mg | INTRAMUSCULAR | Status: DC | PRN

## 2019-01-22 MED ORDER — EPHEDRINE 5 MG/ML INJ: 10.0000 mg | INTRAVENOUS | Status: DC | PRN

## 2019-01-22 MED ORDER — MISOPROSTOL 200 MCG PO TABS
800.0000 ug | ORAL_TABLET | Freq: Once | ORAL | Status: AC
  Administered 2019-01-22: 800 ug via RECTAL

## 2019-01-22 MED ORDER — LACTATED RINGERS IV SOLN
500.0000 mL | Freq: Once | INTRAVENOUS | Status: AC
  Administered 2019-01-22: 500 mL via INTRAVENOUS

## 2019-01-22 MED ORDER — PHENYLEPHRINE 40 MCG/ML (10ML) SYRINGE FOR IV PUSH (FOR BLOOD PRESSURE SUPPORT): 80.0000 ug | PREFILLED_SYRINGE | INTRAVENOUS | Status: DC | PRN

## 2019-01-22 MED ORDER — DIPHENHYDRAMINE HCL 25 MG PO CAPS: 25.0000 mg | ORAL_CAPSULE | Freq: Four times a day (QID) | ORAL | Status: DC | PRN

## 2019-01-22 MED ORDER — ONDANSETRON HCL 4 MG/2ML IJ SOLN: 4.0000 mg | INTRAMUSCULAR | Status: DC | PRN

## 2019-01-22 MED ORDER — FENTANYL CITRATE (PF) 100 MCG/2ML IJ SOLN
100.0000 ug | INTRAMUSCULAR | Status: DC | PRN
  Administered 2019-01-22: 100 ug via INTRAVENOUS
  Filled 2019-01-22: qty 2

## 2019-01-22 MED ORDER — ACETAMINOPHEN 325 MG PO TABS
650.0000 mg | ORAL_TABLET | ORAL | Status: DC | PRN
  Administered 2019-01-22 – 2019-01-24 (×7): 650 mg via ORAL
  Filled 2019-01-22 (×7): qty 2

## 2019-01-22 MED ORDER — SIMETHICONE 80 MG PO CHEW: 80.0000 mg | CHEWABLE_TABLET | ORAL | Status: DC | PRN

## 2019-01-22 MED ORDER — MEASLES, MUMPS & RUBELLA VAC IJ SOLR: 0.5000 mL | Freq: Once | INTRAMUSCULAR | Status: DC

## 2019-01-22 MED ORDER — ONDANSETRON HCL 4 MG/2ML IJ SOLN: 4.0000 mg | Freq: Four times a day (QID) | INTRAMUSCULAR | Status: DC | PRN

## 2019-01-22 MED ORDER — SODIUM CHLORIDE (PF) 0.9 % IJ SOLN
INTRAMUSCULAR | Status: DC | PRN
  Administered 2019-01-22: 12 mL/h via EPIDURAL

## 2019-01-22 MED ORDER — WITCH HAZEL-GLYCERIN EX PADS: 1.0000 "application " | MEDICATED_PAD | CUTANEOUS | Status: DC | PRN

## 2019-01-22 MED ORDER — IBUPROFEN 600 MG PO TABS
600.0000 mg | ORAL_TABLET | Freq: Four times a day (QID) | ORAL | Status: DC
  Administered 2019-01-22 – 2019-01-24 (×8): 600 mg via ORAL
  Filled 2019-01-22 (×8): qty 1

## 2019-01-22 MED ORDER — LIDOCAINE HCL (PF) 1 % IJ SOLN
INTRAMUSCULAR | Status: DC | PRN
  Administered 2019-01-22: 5 mL via EPIDURAL

## 2019-01-22 MED ORDER — TETANUS-DIPHTH-ACELL PERTUSSIS 5-2.5-18.5 LF-MCG/0.5 IM SUSP: 0.5000 mL | Freq: Once | INTRAMUSCULAR | Status: DC

## 2019-01-22 MED ORDER — ZOLPIDEM TARTRATE 5 MG PO TABS: 5.0000 mg | ORAL_TABLET | Freq: Every evening | ORAL | Status: DC | PRN

## 2019-01-22 MED ORDER — LACTATED RINGERS IV SOLN
INTRAVENOUS | Status: DC
  Administered 2019-01-22: 09:00:00 via INTRAVENOUS

## 2019-01-22 MED ORDER — ACETAMINOPHEN 325 MG PO TABS: 650.0000 mg | ORAL_TABLET | ORAL | Status: DC | PRN

## 2019-01-22 MED ORDER — OXYCODONE-ACETAMINOPHEN 5-325 MG PO TABS: 1.0000 | ORAL_TABLET | ORAL | Status: DC | PRN

## 2019-01-22 MED ORDER — PHENYLEPHRINE 40 MCG/ML (10ML) SYRINGE FOR IV PUSH (FOR BLOOD PRESSURE SUPPORT)
80.0000 ug | PREFILLED_SYRINGE | INTRAVENOUS | Status: DC | PRN
  Filled 2019-01-22: qty 10

## 2019-01-22 MED ORDER — OXYTOCIN BOLUS FROM INFUSION
500.0000 mL | Freq: Once | INTRAVENOUS | Status: AC
  Administered 2019-01-22: 500 mL via INTRAVENOUS

## 2019-01-22 MED ORDER — DIBUCAINE 1 % RE OINT: 1.0000 "application " | TOPICAL_OINTMENT | RECTAL | Status: DC | PRN

## 2019-01-22 MED ORDER — COCONUT OIL OIL: 1.0000 "application " | TOPICAL_OIL | Status: DC | PRN

## 2019-01-22 MED ORDER — SENNOSIDES-DOCUSATE SODIUM 8.6-50 MG PO TABS
2.0000 | ORAL_TABLET | ORAL | Status: DC
  Administered 2019-01-22 – 2019-01-23 (×2): 2 via ORAL
  Filled 2019-01-22 (×2): qty 2

## 2019-01-22 MED ORDER — LACTATED RINGERS IV SOLN: 500.0000 mL | Freq: Once | INTRAVENOUS | Status: DC

## 2019-01-22 MED ORDER — OXYTOCIN 40 UNITS IN NORMAL SALINE INFUSION - SIMPLE MED
2.5000 [IU]/h | INTRAVENOUS | Status: DC
  Filled 2019-01-22: qty 1000

## 2019-01-22 MED ORDER — MISOPROSTOL 200 MCG PO TABS
ORAL_TABLET | ORAL | Status: AC
  Filled 2019-01-22: qty 4

## 2019-01-22 MED ORDER — LIDOCAINE HCL (PF) 1 % IJ SOLN: 30.0000 mL | INTRAMUSCULAR | Status: DC | PRN

## 2019-01-22 MED ORDER — BENZOCAINE-MENTHOL 20-0.5 % EX AERO
1.0000 "application " | INHALATION_SPRAY | CUTANEOUS | Status: DC | PRN
  Administered 2019-01-22: 1 via TOPICAL
  Filled 2019-01-22: qty 56

## 2019-01-22 MED ORDER — ONDANSETRON HCL 4 MG PO TABS: 4.0000 mg | ORAL_TABLET | ORAL | Status: DC | PRN

## 2019-01-22 MED ORDER — LACTATED RINGERS IV SOLN: 500.0000 mL | INTRAVENOUS | Status: DC | PRN

## 2019-01-22 MED ORDER — FENTANYL-BUPIVACAINE-NACL 0.5-0.125-0.9 MG/250ML-% EP SOLN: 12.0000 mL/h | EPIDURAL | Status: DC | PRN

## 2019-01-22 MED ORDER — PRENATAL MULTIVITAMIN CH
1.0000 | ORAL_TABLET | Freq: Every day | ORAL | Status: DC
  Administered 2019-01-23 – 2019-01-24 (×2): 1 via ORAL
  Filled 2019-01-22 (×2): qty 1

## 2019-01-22 MED ORDER — SOD CITRATE-CITRIC ACID 500-334 MG/5ML PO SOLN: 30.0000 mL | ORAL | Status: DC | PRN

## 2019-01-22 MED ORDER — FENTANYL-BUPIVACAINE-NACL 0.5-0.125-0.9 MG/250ML-% EP SOLN
12.0000 mL/h | EPIDURAL | Status: DC | PRN
  Filled 2019-01-22: qty 250

## 2019-01-22 NOTE — Discharge Summary (Signed)
Obstetrics Discharge Summary OB/[facility]N [facility]   Patient Name:  Linda Keith DOB: 1996/09/30 MRN: [id number]  Date of admission: 01/22/2019 Delivering MD: Tamera Stands   Date of discharge: 01/24/2019  Admitting diagnosis: [redacted] wks pregnant,bleeding Intrauterine pregnancy: [redacted]w[redacted]d     Secondary diagnosis:   Principal Problem:   Uterine contractions during pregnancy Active Problems:   Previous cesarean delivery affecting pregnancy, antepartum   Normal labor   VBAC (vaginal birth after Cesarean)    Discharge diagnosis: Term Pregnancy Delivered                                            Postpartum procedures: None  Complications: Partial placental abruption   Hospital course: Linda Keith is a 23 y.o. [redacted]w[redacted]d who was admitted for spontaneous onset of labor and presumed partial placental abruption given moderate amount of bleeding on admission. Her pregnancy was complicated by history of prior Cesarean delivery for arrest of dilation. Her labor course was notable for admission in active labor. Delivery was complicated by about 300cc of blood clot immediately after delivery of infant, 1st degree laceration repaired and poor neonatal tone and O2 saturation initially requiring blow-by oxygen. Placenta sent to pathology for presumed partial abruption and cord gas also sent. Please see delivery/op note for additional details. Her postpartum course was uncomplicated. She was breastfeeding without difficulty. By day of discharge, she was passing flatus, urinating, eating and drinking without difficulty. Her pain was well-controlled, and she was discharged home with ibuprofen. She will follow-up in clinic in 4 weeks.   Physical exam  Vitals:   01/23/19 0500 01/23/19 1452 01/23/19 2118 01/24/19 0539  BP: 125/90 115/75 139/86 111/80  Pulse: 80 79 83 83  Resp: 16 20 18 16   Temp: 98.3 F (36.8 C) 98.1 F (36.7 C) 97.7 F (36.5 C) 98.5 F (36.9 C)  TempSrc: Oral Oral  Oral  SpO2: 98%  100% 100% 100%  Weight:      Height:       General: NAD Lochia: appropriate Uterine Fundus: firm Incision: N/A DVT Evaluation: No evidence of DVT seen on physical exam. Labs: Lab Results  Component Value Date   WBC 4.9 01/22/2019   HGB 11.2 (L) 01/22/2019   HCT 36.9 01/22/2019   MCV 76.7 (L) 01/22/2019   PLT 181 01/22/2019   CMP Latest Ref Rng & Units 01/22/2019  Glucose 70 - 99 mg/dL 89  BUN 6 - 20 mg/dL 14  Creatinine 3.81 - 8.29 mg/dL 9.37  Sodium 169 - 678 mmol/L 134(L)  Potassium 3.5 - 5.1 mmol/L 3.9  Chloride 98 - 111 mmol/L 109  CO2 22 - 32 mmol/L 18(L)  Calcium 8.9 - 10.3 mg/dL 8.9  Total Protein 6.5 - 8.1 g/dL 6.4(L)  Total Bilirubin 0.3 - 1.2 mg/dL 0.9  Alkaline Phos 38 - 126 U/L 106  AST 15 - 41 U/L 20  ALT 0 - 44 U/L 15    Discharge instructions: Per After Visit Summary and "Baby and Me Booklet"  After visit meds:  Allergies as of 01/24/2019   No Known Allergies     Medication List    TAKE these medications   ibuprofen 600 MG tablet Commonly known as:  ADVIL,MOTRIN Take 1 tablet (600 mg total) by mouth every 6 (six) hours.   Prenatal 19 tablet Chew 1 tablet by mouth daily.  Postpartum contraception: Nexplanon or cOCPs Diet: Routine Diet Activity: Advance as tolerated. Pelvic rest for 6 weeks.   Follow-up Appt: No future appointments. Please schedule this patient for Postpartum visit in: 4 weeks with the following provider: Any provider Low risk pregnancy complicated by: history of prior C/S Delivery mode:  VBAC Anticipated Birth Control:  Nexplanon or cOCPs PP Procedures needed: none  Schedule Integrated BH visit: no  Newborn Data: Live born female  Birth Weight:   APGAR: 5, 8  Newborn Delivery   Birth date/time:  01/22/2019 12:19:00 Delivery type:  VBAC, Spontaneous    Baby Feeding: Both Disposition:home with mother  Gwenevere Abbot, MD  OB/GYN Fellow, Faculty Practice

## 2019-01-22 NOTE — MAU Provider Note (Signed)
First Provider Initiated Contact with Patient 01/22/19 0840       S: [name] is a 23 y.o. G2P1001 at [redacted]w[redacted]d who presents to MAU today complaining of bleeding and ctx. The pain woke her up around 0745 and there was a large amt of red blood in her bed. She went to the BR and saw more blood in the toilet. Last IC was yesterday. Denies leaking watery fluid. She reports normal fetal movement.    O: BP 132/86 (BP Location: Right Arm)   Pulse 98   Temp 98.1 F (36.7 C) (Oral)   Resp 18   Ht 5\' 3"  (1.6 m)   Wt 86.7 kg   LMP 05/06/2018 (Exact Date)   SpO2 100%   BMI 33.85 kg/m  GENERAL: Well-developed, well-nourished female in no acute distress.  HEAD: Normocephalic, atraumatic.  CHEST: Normal effort of breathing, regular heart rate ABDOMEN: Soft, nontender, gravid  Cervical exam:  Dilation: 4 Effacement (%): 80 Station: -2 Presentation: Vertex Exam by:: Fabian November, CNM Speculum: large amt blood and clots in vault  Fetal Monitoring: Baseline: 140 Variability: mod Accelerations: + Decelerations: no Contractions: q2-3  A: SIUP at [redacted]w[redacted]d  Active labor TOLAC  P: Admit to LD  PEC labs >BP borderline Mngt per labor team  Donette Larry, CNM 01/22/2019 9:15 AM

## 2019-01-22 NOTE — MAU Note (Signed)
Linda Keith is a 23 y.o. at [redacted]w[redacted]d here in MAU reporting: woke up with contractions at about 0745. States she also saw a lot of bleeding, says her sheets were covered in blood and it was all over the floor. Reports no LOF, + FM  Onset of complaint: 0745  Pain score: 10/10  Vitals:   01/22/19 0826  BP: 130/89  Pulse: 96  Resp: 18  Temp: 98.1 F (36.7 C)  SpO2: 99%      FHT:136  Lab orders placed from triage: none

## 2019-01-22 NOTE — Anesthesia Preprocedure Evaluation (Addendum)
Anesthesia Evaluation  Patient identified by MRN, date of birth, ID band Patient awake    Reviewed: Allergy & Precautions, NPO status , Patient's Chart, lab work & pertinent test results  Airway Mallampati: II  TM Distance: >3 FB Neck ROM: Full    Dental no notable dental hx. (+) Teeth Intact   Pulmonary neg pulmonary ROS,    Pulmonary exam normal breath sounds clear to auscultation       Cardiovascular Exercise Tolerance: Good negative cardio ROS Normal cardiovascular exam Rhythm:Regular Rate:Normal     Neuro/Psych negative neurological ROS     GI/Hepatic negative GI ROS, Neg liver ROS,   Endo/Other    Renal/GU negative Renal ROS     Musculoskeletal   Abdominal   Peds  Hematology Hgb 11.2 Plts 181   T&S available   Anesthesia Other Findings   Reproductive/Obstetrics (+) Pregnancy                             Anesthesia Physical Anesthesia Plan  ASA: II  Anesthesia Plan: Epidural   Post-op Pain Management:    Induction:   PONV Risk Score and Plan:   Airway Management Planned:   Additional Equipment:   Intra-op Plan:   Post-operative Plan:   Informed Consent:   Plan Discussed with:   Anesthesia Plan Comments: (37.[redacted] wk GA for TOLAC  T&S available  Desires LEA)        Anesthesia Quick Evaluation

## 2019-01-22 NOTE — H&P (Signed)
OBSTETRIC ADMISSION HISTORY AND PHYSICAL  Linda Keith is a 23 y.o. female G2P1001 with IUP at [redacted]w[redacted]d by L/10 presenting for spontaneous onset of labor, bloody show.   Reports fetal movement. Denies leakage of fluids.  She received her prenatal care at [facility].  Support person in labor: Partner  Ultrasounds . 10w1: viability U/S . 18w6: posterior placenta, normal anatomy U/S  Prenatal History/Complications: . TOLAC - arrest of dilation previously  . Positive Flu A this pregnancy   Past Medical History: Past Medical History:  Diagnosis Date  . Anemia   . Prenatal care 08/07/2014    Nursing Staff Provider Office Location  [facility] Dating  LMP:05/06/18 Language  English Anatomy US   normal Flu Vaccine  08/02/18 Genetic Screen  Quad: WNL  TDaP vaccine    Hgb A1C or  GTT Early  Third trimester 112 Rhogam  NA   LAB RESULTS  Feeding Plan  Blood Type O/Positive/-- (09/04 1459) O+ Contraception  Antibody Negative (09/04 1459)Negative Circumcision  yes Rubella 4.26 (09/04 1459) 4.26 immune    Past Surgical History: Past Surgical History:  Procedure Laterality Date  . CESAREAN SECTION N/A 11/14/2014   Procedure: CESAREAN SECTION;  Surgeon: Catalina Antigua, MD;  Location: [facility];  Service: Obstetrics;  Laterality: N/A;  . NO PAST SURGERIES      Obstetrical History: OB History    Gravida  2   Para  1   Term  1   Preterm      AB      Living  1     SAB      TAB      Ectopic      Multiple  0   Live Births  1           Social History: Social History   Socioeconomic History  . Marital status: Single    Spouse name: Not on file  . Number of children: Not on file  . Years of education: Not on file  . Highest education level: Not on file  Occupational History  . Not on file  Social Needs  . Financial resource strain: Not on file  . Food insecurity:    Worry: Not on file    Inability: Not on file  . Transportation needs:    Medical: Not on file    Non-medical: Not on  file  Tobacco Use  . Smoking status: Never Smoker  . Smokeless tobacco: Never Used  Substance and Sexual Activity  . Alcohol use: No    Alcohol/week: 0.0 standard drinks  . Drug use: No  . Sexual activity: Yes    Birth control/protection: None    Comment: Wants Nexplanon after pregnancy  Lifestyle  . Physical activity:    Days per week: Not on file    Minutes per session: Not on file  . Stress: Not on file  Relationships  . Social connections:    Talks on phone: Not on file    Gets together: Not on file    Attends religious service: Not on file    Active member of club or organization: Not on file    Attends meetings of clubs or organizations: Not on file    Relationship status: Not on file  Other Topics Concern  . Not on file  Social History Narrative  . Not on file    Family History: Family History  Problem Relation Age of Onset  . Cardiomyopathy Mother   . Peripheral Artery Disease Mother  Allergies: No Known Allergies  Medications Prior to Admission  Medication Sig Dispense Refill Last Dose  . metroNIDAZOLE (FLAGYL) 500 MG tablet Take 1 tablet (500 mg total) by mouth 2 (two) times daily for 7 days. 14 tablet 0 01/21/2019 at Unknown time  . Prenatal Vit-Fe Fumarate-FA (PRENATAL 19) tablet Chew 1 tablet by mouth daily. 30 tablet 8 01/21/2019 at Unknown time  . Doxylamine-Pyridoxine 10-10 MG TBEC 2 PO qhs; may take 1po in am and 1po in afternoon prn nausea (Patient not taking: Reported on 01/22/2019) 120 tablet 3 Not Taking at Unknown time  . ondansetron (ZOFRAN-ODT) 8 MG disintegrating tablet Take 1 tablet (8 mg total) by mouth every 8 (eight) hours as needed for nausea. (Patient not taking: Reported on 01/22/2019) 20 tablet 2 Not Taking at Unknown time     Review of Systems  All systems reviewed and negative except as stated in HPI  Blood pressure 127/88, pulse 92, temperature 97.6 F (36.4 C), temperature source Oral, resp. rate 16, height  (1.6 m), weight  86.7 kg, last menstrual period 05/06/2018, SpO2 100 %. General appearance: alert, uncomfortable appearing Lungs: no respiratory distress Heart: regular rate  Abdomen: gravid, appropriately tender, tone is soft in between contractions Pelvic: deferred Extremities: no significant LE edema  Presentation: cephalic by prior checks  Fetal monitoring: 130s/mod/+a/-d Uterine activity: ever 1-4 minutes  Dilation: 4 Effacement (%): 80 Station: -2 Exam by:: Fabian November, CNM  Prenatal labs: ABO, Rh: --/--/O POS (03/29 7829) Antibody: NEG (03/29 5621) Rubella: 4.26 (09/04 1459) RPR: Non Reactive (02/07 1038)  HBsAg: Negative (09/04 1459)  HIV: Non Reactive (02/07 1038)  GBS:   negative Glucola: normal 1-hr Genetic screening:  Negative quad screen   Prenatal Transfer Tool  Maternal Diabetes: No Genetic Screening: Normal Maternal Ultrasounds/Referrals: Normal Fetal Ultrasounds or other Referrals:  None Maternal Substance Abuse:  No - UDS to be collected on admission given concern for partial abruption with bleeding Significant Maternal Medications:  PNV, flagyl, diclegis, Zofran Significant Maternal Lab Results: flu A+ this pregnancy (3 months ago)  Results for orders placed or performed during the hospital encounter of 01/22/19 (from the past 24 hour(s))  Type and screen [facility]   Collection Time: 01/22/19  8:55 AM  Result Value Ref Range   ABO/RH(D) O POS    Antibody Screen NEG    Sample Expiration      01/25/2019 Performed at [facility], [address] [place], [place] [address]   CBC   Collection Time: 01/22/19  8:58 AM  Result Value Ref Range   WBC 4.9 4.0 - 10.5 K/uL   RBC 4.81 3.87 - 5.11 MIL/uL   Hemoglobin 11.2 (L) 12.0 - 15.0 g/dL   HCT 78.4 69.6 - 29.5 %   MCV 76.7 (L) 80.0 - 100.0 fL   MCH 23.3 (L) 26.0 - 34.0 pg   MCHC 30.4 30.0 - 36.0 g/dL   RDW 28.4 13.2 - 44.0 %   Platelets 181 150 - 400 K/uL   nRBC 0.0 0.0 - 0.2 %  Comprehensive  metabolic panel   Collection Time: 01/22/19  8:58 AM  Result Value Ref Range   Sodium 134 (L) 135 - 145 mmol/L   Potassium 3.9 3.5 - 5.1 mmol/L   Chloride 109 98 - 111 mmol/L   CO2 18 (L) 22 - 32 mmol/L   Glucose, Bld 89 70 - 99 mg/dL   BUN 14 6 - 20 mg/dL   Creatinine, Ser  0.67 0.44 - 1.00 mg/dL   Calcium 8.9 8.9 - 16.1 mg/dL   Total Protein 6.4 (L) 6.5 - 8.1 g/dL   Albumin 3.1 (L) 3.5 - 5.0 g/dL   AST 20 15 - 41 U/L   ALT 15 0 - 44 U/L   Alkaline Phosphatase 106 38 - 126 U/L   Total Bilirubin 0.9 0.3 - 1.2 mg/dL   GFR calc non Af Amer >60 >60 mL/min   GFR calc Af Amer >60 >60 mL/min   Anion gap 7 5 - 15  Fibrinogen   Collection Time: 01/22/19  9:46 AM  Result Value Ref Range   Fibrinogen 517 (H) 210 - 475 mg/dL  Protime-INR   Collection Time: 01/22/19  9:46 AM  Result Value Ref Range   Prothrombin Time 13.3 11.4 - 15.2 seconds   INR 1.0 0.8 - 1.2  APTT   Collection Time: 01/22/19  9:46 AM  Result Value Ref Range   aPTT 26 24 - 36 seconds    Patient Active Problem List   Diagnosis Date Noted  . Normal labor 01/22/2019  . Previous cesarean delivery affecting pregnancy, antepartum 12/12/2018  . Fatigue 03/18/2018  . History of sexually transmitted disease 12/24/2017  . Uterine contractions during pregnancy 11/13/2014  . Supervision of low-risk pregnancy 08/07/2014  . Chronic pain of left knee 01/08/2014    Assessment/Plan:  Linda Keith is a 23 y.o. G2P1001 at [redacted]w[redacted]d here for spontaneous onset of labor, vaginal bleeding.   Labor: Spontaneous onset of labor with contractions this morning, still intact. Some suspicion for partial placental abruption given degree of bloody show but fetal heart rate has continued to remain reassuring, VSS. -- pain control: epidural -- TOLAC consent verified verbally, written consent with Dr. Shawnie Pons in clinic 2/20 -- will check DIC panel, KB stain, UDS given concern for partial abruption, O+ blood type   Elevated Blood Pressures MAU:  Likely related to pain.  -- CMP, CBC, UPC pending   Fetal Wellbeing: EFW 6-7lbs by Leopold's. Cephalic by prior checks.  -- GBS (negative) -- continuous fetal monitoring - category I   Postpartum Planning -- both/Nexplanon or OCPs -- RI/[x] Tdap   Noel Rodier S. Earlene Plater, DO OB/GYN Fellow

## 2019-01-22 NOTE — Anesthesia Procedure Notes (Signed)
Epidural Patient location during procedure: OB Start time: 01/22/2019 10:20 AM End time: 01/22/2019 10:35 AM  Staffing Anesthesiologist: Trevor Iha, MD Performed: anesthesiologist   Preanesthetic Checklist Completed: patient identified, site marked, surgical consent, pre-op evaluation, timeout performed, IV checked, risks and benefits discussed and monitors and equipment checked  Epidural Patient position: sitting Prep: site prepped and draped and DuraPrep Patient monitoring: continuous pulse ox and blood pressure Approach: midline Location: L3-L4 Injection technique: LOR air  Needle:  Needle type: Tuohy  Needle gauge: 17 G Needle length: 9 cm and 9 Needle insertion depth: 7 cm Catheter type: closed end flexible Catheter size: 19 Gauge Catheter at skin depth: 12 cm Test dose: negative  Assessment Events: blood not aspirated, injection not painful, no injection resistance, negative IV test and no paresthesia  Additional Notes Patient identified. Risks/Benefits/Options discussed with patient including but not limited to bleeding, infection, nerve damage, paralysis, failed block, incomplete pain control, headache, blood pressure changes, nausea, vomiting, reactions to medication both or allergic, itching and postpartum back pain. Confirmed with bedside nurse the patient's most recent platelet count. Confirmed with patient that they are not currently taking any anticoagulation, have any bleeding history or any family history of bleeding disorders. Patient expressed understanding and wished to proceed. All questions were answered. Sterile technique was used throughout the entire procedure. Please see nursing notes for vital signs. Test dose was given through epidural needle and negative prior to continuing to dose epidural or start infusion. Warning signs of high block given to the patient including shortness of breath, tingling/numbness in hands, complete motor block, or any  concerning symptoms with instructions to call for help. Patient was given instructions on fall risk and not to get out of bed. All questions and concerns addressed with instructions to call with any issues.  1 Attempt (S) . Patient tolerated procedure well.

## 2019-01-23 ENCOUNTER — Encounter: Payer: Self-pay | Admitting: Family Medicine

## 2019-01-23 LAB — RPR: RPR Ser Ql: NONREACTIVE

## 2019-01-23 NOTE — Anesthesia Postprocedure Evaluation (Signed)
Anesthesia Post Note  Patient: Linda Keith  Procedure(s) Performed: AN AD HOC LABOR EPIDURAL     Patient location during evaluation: Mother Baby Anesthesia Type: Epidural Level of consciousness: awake and alert Pain management: pain level controlled Vital Signs Assessment: post-procedure vital signs reviewed and stable Respiratory status: spontaneous breathing Cardiovascular status: blood pressure returned to baseline Postop Assessment: no headache, no backache, epidural receding, patient able to bend at knees, no apparent nausea or vomiting, adequate PO intake and able to ambulate Anesthetic complications: no    Last Vitals:  Vitals:   01/22/19 2358 01/23/19 0500  BP: 107/69 125/90  Pulse: 92 80  Resp: 18 16  Temp: 36.9 C 36.8 C  SpO2: 94% 98%    Last Pain:  Vitals:   01/23/19 0508  TempSrc:   PainSc: 8    Pain Goal:                   [facility]

## 2019-01-23 NOTE — Progress Notes (Signed)
Post Partum Day 1 Subjective: no complaints, up ad lib, voiding, tolerating PO and + flatus  Objective: Blood pressure 125/90, pulse 80, temperature 98.3 F (36.8 C), temperature source Oral, resp. rate 16, height 5\' 3"  (1.6 m), weight 86.7 kg, last menstrual period 05/06/2018, SpO2 98 %.  Physical Exam:  General: alert, cooperative and no distress Lochia: appropriate Uterine Fundus: firm Incision: n/a DVT Evaluation: No evidence of DVT seen on physical exam.  Recent Labs    01/22/19 0858  HGB 11.2*  HCT 36.9    Assessment/Plan: Patient doing well. Desires discharge today but infant on bili lights. If infant can be discharged, will d/c mom as well. Otherwise plan for discharge tomorrow.   LOS: 1 day   Rolm Bookbinder CNM 01/23/2019, 7:37 AM

## 2019-01-23 NOTE — Lactation Note (Signed)
This note was copied from a baby's chart. Lactation Consultation Note  Patient Name: Linda Keith FWYOV'Z Date: 01/23/2019 Reason for consult: Follow-up assessment;Hyperbilirubinemia;Early term 76-38.6wks Baby is 69 hours old and receiving phototherapy.  DAT +.  Mom has not attempted to put baby to breast.  Baby formula feeding.  Discussed initiating pumping and mom is interested but would like to wait.  Will follow up later.  Maternal Data    Feeding Feeding Type: Formula Nipple Type: Slow - flow  LATCH Score                   Interventions    Lactation Tools Discussed/Used     Consult Status Consult Status: Follow-up Date: 01/24/19 Follow-up type: In-patient    Huston Foley 01/23/2019, 9:26 AM

## 2019-01-24 ENCOUNTER — Encounter (HOSPITAL_COMMUNITY): Payer: Self-pay | Admitting: *Deleted

## 2019-01-24 MED ORDER — IBUPROFEN 600 MG PO TABS: 600.0000 mg | ORAL_TABLET | Freq: Four times a day (QID) | ORAL | 0 refills | Status: DC

## 2019-01-25 ENCOUNTER — Other Ambulatory Visit: Payer: Self-pay | Admitting: Family Medicine

## 2019-04-17 ENCOUNTER — Emergency Department (HOSPITAL_COMMUNITY)
Admission: EM | Admit: 2019-04-17 | Discharge: 2019-04-17 | Disposition: A | Discharge status: Home / Self Care | Payer: Medicaid Other | Attending: Emergency Medicine | Admitting: Emergency Medicine

## 2019-04-17 ENCOUNTER — Other Ambulatory Visit: Payer: Self-pay

## 2019-04-17 ENCOUNTER — Encounter (HOSPITAL_COMMUNITY): Payer: Self-pay | Admitting: Emergency Medicine

## 2019-04-17 DIAGNOSIS — F329 Major depressive disorder, single episode, unspecified: Secondary | ICD-10-CM | POA: Insufficient documentation

## 2019-04-17 DIAGNOSIS — F32A Depression, unspecified: Secondary | ICD-10-CM

## 2019-04-17 NOTE — ED Triage Notes (Signed)
Pt reports that she had baby 4 months and had depression that has gotten worse and having family issues with her mother. Pt reports that she her baby daddy, 22 yr old and 103 month old all live together and there are no problems at the home. Denies SI or HI. Called her PCP who referred her to Ed since couldn't see her today. Pt very tearful during triage.

## 2019-04-17 NOTE — ED Notes (Signed)
Bed: WLPT4 Expected date:  Expected time:  Means of arrival:  Comments: 

## 2019-04-17 NOTE — ED Provider Notes (Signed)
[facility]-EMERGENCY DEPT Provider Note   CSN: [id number] Arrival date & time: 04/17/19  1631    History   Chief Complaint Chief Complaint  Patient presents with  . Depression    post-partum    HPI Linda Keith is a 23 y.o. female.     The history is provided by the patient.  Depression This is a new problem. The current episode started more than 2 days ago. The problem occurs daily. The problem has not changed since onset.Pertinent negatives include no chest pain, no abdominal pain, no headaches and no shortness of breath. Nothing aggravates the symptoms. Nothing relieves the symptoms. She has tried nothing for the symptoms. The treatment provided no relief.    Past Medical History:  Diagnosis Date  . Anemia   . Prenatal care 08/07/2014    [facility]ursing [facility] Dating  LMP:05/06/18 Language  English Anatomy US   normal Flu Vaccine  08/02/18 Genetic Screen  Quad: WNL  TDaP vaccine    Hgb A1C or  GTT Early  Third trimester 112 Rhogam  NA   LAB RESULTS  Feeding Plan  Blood Type O/Positive/-- (09/04 1459) O+ Contraception  Antibody Negative (09/04 1459)Negative Circumcision  yes Rubella 4.26 (09/04 1459) 4.26 immune    Patient Active Problem List   Diagnosis Date Noted  . Normal labor 01/22/2019  . VBAC (vaginal birth after Cesarean) 01/22/2019  . Previous cesarean delivery affecting pregnancy, antepartum 12/12/2018  . Fatigue 03/18/2018  . History of sexually transmitted disease 12/24/2017  . Uterine contractions during pregnancy 11/13/2014  . Supervision of low-risk pregnancy 08/07/2014  . Chronic pain of left knee 01/08/2014    Past Surgical History:  Procedure Laterality Date  . CESAREAN SECTION N/A 11/14/2014   Procedure: CESAREAN SECTION;  Surgeon: Catalina AntiguaPeggy Constant, MD;  Location: [facility];  Service: Obstetrics;  Laterality: N/A;  . NO PAST SURGERIES       OB History    Gravida  2   Para  1   Term  1   Preterm       AB      Living  1     SAB      TAB      Ectopic      Multiple  0   Live Births  1            Home Medications    Prior to Admission medications   Medication Sig Start Date End Date Taking? Authorizing Provider  ibuprofen (ADVIL,MOTRIN) 600 MG tablet Take 1 tablet (600 mg total) by mouth every 6 (six) hours. 01/24/19   Gwenevere AbbotPhillip, Nimeka, MD  Prenatal Vit-Fe Fumarate-FA (PRENATAL 19) tablet Chew 1 tablet by mouth daily. 06/29/18   Arlyce HarmanLockamy, Timothy, DO    Family History Family History  Problem Relation Age of Onset  . Cardiomyopathy Mother   . Peripheral Artery Disease Mother     Social History Social History   Tobacco Use  . Smoking status: Never Smoker  . Smokeless tobacco: Never Used  Substance Use Topics  . Alcohol use: No    Alcohol/week: 0.0 standard drinks  . Drug use: No     Allergies   Patient has no known allergies.   Review of Systems Review of Systems  Constitutional: Negative for chills and fever.  HENT: Negative for ear pain and sore throat.   Eyes: Negative for pain and visual disturbance.  Respiratory: Negative for cough and shortness of breath.  Cardiovascular: Negative for chest pain and palpitations.  Gastrointestinal: Negative for abdominal pain and vomiting.  Genitourinary: Negative for dysuria and hematuria.  Musculoskeletal: Negative for arthralgias and back pain.  Skin: Negative for color change and rash.  Neurological: Negative for seizures, syncope and headaches.  Psychiatric/Behavioral: Positive for depression. Negative for agitation, behavioral problems, confusion, decreased concentration and suicidal ideas. The patient is not nervous/anxious.   All other systems reviewed and are negative.    Physical Exam Updated Vital Signs  ED Triage Vitals  Enc Vitals Group     BP 04/17/19 1652 (!) 135/101     Pulse Rate 04/17/19 1652 98     Resp 04/17/19 1652 16     Temp 04/17/19 1652 98.8 F (37.1 C)     Temp Source 04/17/19  1652 Oral     SpO2 04/17/19 1652 100 %     Weight --      Height --      Head Circumference --      Peak Flow --      Pain Score 04/17/19 1653 0     Pain Loc --      Pain Edu? --      Excl. in [place]? --     Physical Exam Vitals signs and nursing note reviewed.  Constitutional:      General: She is not in acute distress.    Appearance: She is well-developed.  HENT:     Head: Normocephalic and atraumatic.     Nose: Nose normal.     Mouth/Throat:     Mouth: Mucous membranes are moist.  Eyes:     Extraocular Movements: Extraocular movements intact.     Conjunctiva/sclera: Conjunctivae normal.     Pupils: Pupils are equal, round, and reactive to light.  Neck:     Musculoskeletal: Normal range of motion and neck supple.  Cardiovascular:     Rate and Rhythm: Normal rate and regular rhythm.     Heart sounds: No murmur.  Pulmonary:     Effort: Pulmonary effort is normal. No respiratory distress.     Breath sounds: Normal breath sounds.  Abdominal:     Palpations: Abdomen is soft.     Tenderness: There is no abdominal tenderness.  Skin:    General: Skin is warm and dry.     Capillary Refill: Capillary refill takes less than 2 seconds.  Neurological:     General: No focal deficit present.     Mental Status: She is alert.  Psychiatric:        Mood and Affect: Mood is depressed. Mood is not anxious.        Speech: Speech normal.        Behavior: Behavior normal.        Thought Content: Thought content normal. Thought content does not include homicidal or suicidal ideation. Thought content does not include homicidal or suicidal plan.        Cognition and Memory: Cognition normal.        Judgment: Judgment normal.      ED Treatments / Results  Labs (all labs ordered are listed, but only abnormal results are displayed) Labs Reviewed - No data to display  EKG None  Radiology No results found.  Procedures Procedures (including critical care time)  Medications Ordered in  ED Medications - No data to display   Initial Impression / Assessment and Plan / ED Course  I have reviewed the triage vital signs and the nursing notes.  Pertinent labs & imaging results that were available during my care of the patient were reviewed by me and considered in my medical decision making (see chart for details).   Achaia Paul HalfM Wellborn is a 23 year old female who presents to the ED with depression.  Patient with normal vitals.  No fever.  Patient recently gave birth several months ago to a baby.  She has had some depression but no suicidal homicidal thoughts.  She has had one previous child with no postpartum depression.  She has no plans to hurt herself or her children.  She is just seeking some mental health.  Overall there is no emergent concerns at this time.  She is well-appearing.  Normal vitals.  We will give her information for psychiatry follow-up.  Discharged in good condition.  I was able to contract for safety with the patient and she states that she will return to the ED if symptoms worsen.  This chart was dictated using voice recognition software.  Despite best efforts to proofread,  errors can occur which can change the documentation meaning.    Final Clinical Impressions(s) / ED Diagnoses   Final diagnoses:  Depression, unspecified depression type    ED Discharge Orders    None       Virgina NorfolkCuratolo, Li Fragoso, DO 04/17/19 1823

## 2019-04-18 ENCOUNTER — Encounter: Payer: Self-pay | Admitting: Family Medicine

## 2019-04-20 ENCOUNTER — Other Ambulatory Visit: Payer: Self-pay

## 2019-04-20 ENCOUNTER — Ambulatory Visit (INDEPENDENT_AMBULATORY_CARE_PROVIDER_SITE_OTHER): Payer: Medicaid Other | Admitting: Family Medicine

## 2019-04-20 ENCOUNTER — Encounter: Payer: Self-pay | Admitting: Family Medicine

## 2019-04-20 VITALS — BP 110/60 | HR 92 | Ht 63.0 in | Wt 165.1 lb

## 2019-04-20 DIAGNOSIS — Z3200 Encounter for pregnancy test, result unknown: Secondary | ICD-10-CM

## 2019-04-20 DIAGNOSIS — F320 Major depressive disorder, single episode, mild: Secondary | ICD-10-CM | POA: Diagnosis not present

## 2019-04-20 LAB — POCT URINE PREGNANCY: Preg Test, Ur: POSITIVE — AB

## 2019-04-20 NOTE — Progress Notes (Signed)
Subjective:  Postpartum visit: patient is 12 weeks postpartum following a SVD delivery.  Patient had a recent visit to the ED endorsing sadness.  Patient still able to care for baby reports that she has been doing better since ED visit on 6/22.  Endorse good social support.  Denies being overwhelmed but would like to have less days at work.  Patient has placed request for part-time.  She is still waiting for response.  Patient also slightly concerned that she may be pregnant.  She did not take pregnancy test at home.  Patient last menstrual cycle was in early May.  She currently has no contraception method.  -Breastfeeding: No, bottle formula  -Contraception: None, considering nexplanon -Bleeding: None -Bonding with infant: Yes  -Sexual activity: sexuall -Mood: intermittent sadness  Objective:  General: NAD, pleasant, able to participate in exam Cardiac: RRR, normal heart sounds, no murmurs. 2+ radial and PT pulses bilaterally Respiratory: CTAB, normal effort, No wheezes, rales or rhonchi Abdomen: soft, nontender, nondistended, no hepatic or splenomegaly, +BS Extremities: no edema or cyanosis. WWP. Skin: warm and dry, no rashes noted Neuro: alert and oriented x4, no focal deficits Psych: Normal affect and mood  Assessment/plan:  Edinburgh depression score 11.  Discussed with patient need to start her on sertraline given current symptoms and recent visit to the ED for sadness. Patient however just found out today in clinic that she was pregnant and would like to hold off on any medication until she makes a decision on her current pregnancy.  Patient still reporting good bonding with baby.  She has not experienced any extreme sadness or difficulty caring for her baby.  Patient endorsed good social support with FOB involved children's care.  Mom does have a 33-year-old in addition to newborn.  Patient will follow-up in next few days to discuss need for medication initiation as well as physician  decision on current pregnancy.  Patient is considering Nexplanon in the future.

## 2019-04-24 ENCOUNTER — Encounter: Payer: Self-pay | Admitting: Family Medicine

## 2019-04-26 ENCOUNTER — Other Ambulatory Visit: Payer: Self-pay

## 2019-04-26 ENCOUNTER — Telehealth (INDEPENDENT_AMBULATORY_CARE_PROVIDER_SITE_OTHER): Payer: Medicaid Other | Admitting: Family Medicine

## 2019-04-26 ENCOUNTER — Telehealth: Payer: Self-pay | Admitting: *Deleted

## 2019-04-26 DIAGNOSIS — F320 Major depressive disorder, single episode, mild: Secondary | ICD-10-CM | POA: Diagnosis not present

## 2019-04-26 MED ORDER — SERTRALINE HCL 50 MG PO TABS: 50.0000 mg | ORAL_TABLET | Freq: Every day | ORAL | 3 refills | Status: DC

## 2019-04-26 NOTE — Telephone Encounter (Signed)
Attempted to call patient regarding request. Dr. Andy Gauss had recommended follow up in next few days--- regarding mood and decision on pregnancy. Please schedule with new PCP- he has openings next week. Could be virtual visit.   Linda Singh, MD  Family Medicine Teaching Service

## 2019-04-26 NOTE — Telephone Encounter (Signed)
Patient called today to ask about the letter she discussed with Dr. Andy Gauss. Letter was not written before he left yesterday .  Will check with preceptor to see if she can write this.  Dr. Andy Gauss and patient discussed her going part time due to her depression.  Shelda Truby,CMA

## 2019-04-26 NOTE — Telephone Encounter (Signed)
Patient scheduled for this afternoon to discuss with new PCP via telephone.  Will forward to him so he is aware.  Linda Keith,CMA

## 2019-04-26 NOTE — Progress Notes (Signed)
[facility] Telemedicine Visit  Patient consented to have virtual visit. Method of visit: Telephone  Encounter participants: Patient: Linda Keith - located at home Provider: Bonnita Hollow - located at office Others (if applicable): none  Chief Complaint: Depression  HPI:  Patient with history of depression in setting of newly discovered pregnancy and [place] score of 11.  This was on 04/20/2019.  Patient was hesitant initially to keep the pregnancy and wanted to decide on this before starting treatment for her depression.  Patient has now decided to start treatment and to keep pregnancy.  As per discussion with Dr. Ether Griffins on the 25th, plan to start sertraline.  Patient is comfortable with this.  Discussed risk and benefits of starting this medication in pregnancy.  I do feel that treatment of pregnancy outweighs the risk in this setting.  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  She is requesting to do FMLA and to work part-time.  She is requesting a note to go along with her FMLA paperwork.  ROS: per HPI  Pertinent PMHx: History of major depressive disorder, history of recent delivery,   Exam:  Respiratory: Speaks on phone in full sentences without difficulty  Assessment/Plan: Depression Given patient's recent discussion.  Will start low-dose Zoloft 50 mg.  Titrate in 1 month.  Patient to follow-up in 1 month Prenatal care Patient encouraged to make an appointment for prenatal care. No problem-specific Assessment & Plan notes found for this encounter.    Time spent during visit with patient: 10 minutes

## 2019-05-03 ENCOUNTER — Encounter: Payer: Medicaid Other | Admitting: Family Medicine

## 2019-05-03 ENCOUNTER — Other Ambulatory Visit: Payer: Self-pay

## 2019-05-04 ENCOUNTER — Inpatient Hospital Stay (HOSPITAL_COMMUNITY)
Admission: AD | Admit: 2019-05-04 | Discharge: 2019-05-04 | Disposition: A | Discharge status: Home / Self Care | Payer: Medicaid Other | Attending: Obstetrics & Gynecology | Admitting: Obstetrics & Gynecology

## 2019-05-04 ENCOUNTER — Encounter (HOSPITAL_COMMUNITY): Payer: Self-pay | Admitting: *Deleted

## 2019-05-04 ENCOUNTER — Inpatient Hospital Stay (HOSPITAL_COMMUNITY): Payer: Medicaid Other

## 2019-05-04 ENCOUNTER — Other Ambulatory Visit: Payer: Self-pay

## 2019-05-04 ENCOUNTER — Telehealth: Payer: Self-pay | Admitting: Family Medicine

## 2019-05-04 DIAGNOSIS — Z3A01 Less than 8 weeks gestation of pregnancy: Secondary | ICD-10-CM | POA: Diagnosis not present

## 2019-05-04 DIAGNOSIS — O418X10 Other specified disorders of amniotic fluid and membranes, first trimester, not applicable or unspecified: Secondary | ICD-10-CM

## 2019-05-04 DIAGNOSIS — O209 Hemorrhage in early pregnancy, unspecified: Secondary | ICD-10-CM

## 2019-05-04 DIAGNOSIS — Z3A09 9 weeks gestation of pregnancy: Secondary | ICD-10-CM | POA: Insufficient documentation

## 2019-05-04 DIAGNOSIS — O360910 Maternal care for other rhesus isoimmunization, first trimester, not applicable or unspecified: Secondary | ICD-10-CM | POA: Diagnosis not present

## 2019-05-04 DIAGNOSIS — O208 Other hemorrhage in early pregnancy: Secondary | ICD-10-CM | POA: Diagnosis not present

## 2019-05-04 DIAGNOSIS — O468X1 Other antepartum hemorrhage, first trimester: Secondary | ICD-10-CM | POA: Diagnosis not present

## 2019-05-04 DIAGNOSIS — R1030 Lower abdominal pain, unspecified: Secondary | ICD-10-CM | POA: Insufficient documentation

## 2019-05-04 DIAGNOSIS — Z6790 Unspecified blood type, Rh positive: Secondary | ICD-10-CM

## 2019-05-04 HISTORY — DX: Depression, unspecified: F32.A

## 2019-05-04 LAB — URINALYSIS, ROUTINE W REFLEX MICROSCOPIC
Bilirubin Urine: NEGATIVE
Glucose, UA: NEGATIVE mg/dL
Hgb urine dipstick: NEGATIVE
Ketones, ur: 5 mg/dL — AB
Leukocytes,Ua: NEGATIVE
Nitrite: NEGATIVE
Protein, ur: NEGATIVE mg/dL
Specific Gravity, Urine: 1.029 (ref 1.005–1.030)
pH: 5 (ref 5.0–8.0)

## 2019-05-04 LAB — CBC
HCT: 36.8 % (ref 36.0–46.0)
Hemoglobin: 11.5 g/dL — ABNORMAL LOW (ref 12.0–15.0)
MCH: 22.8 pg — ABNORMAL LOW (ref 26.0–34.0)
MCHC: 31.3 g/dL (ref 30.0–36.0)
MCV: 73 fL — ABNORMAL LOW (ref 80.0–100.0)
Platelets: 205 10*3/uL (ref 150–400)
RBC: 5.04 MIL/uL (ref 3.87–5.11)
RDW: 15.8 % — ABNORMAL HIGH (ref 11.5–15.5)
WBC: 5.3 10*3/uL (ref 4.0–10.5)
nRBC: 0 % (ref 0.0–0.2)

## 2019-05-04 LAB — HCG, QUANTITATIVE, PREGNANCY: hCG, Beta Chain, Quant, S: 33308 m[IU]/mL — ABNORMAL HIGH (ref <5)

## 2019-05-04 LAB — WET PREP, GENITAL
Sperm: NONE SEEN
Trich, Wet Prep: NONE SEEN
Yeast Wet Prep HPF POC: NONE SEEN

## 2019-05-04 MED ORDER — PRENATAL 19 PO CHEW: 1.0000 | CHEWABLE_TABLET | Freq: Every day | ORAL | 11 refills | Status: AC

## 2019-05-04 NOTE — Progress Notes (Signed)
Linda Keith CNM in earlier to discuss test results and d/c plan with pt. Written and verbal d/c instructions given and understanding voiced

## 2019-05-04 NOTE — MAU Note (Signed)
.   Shallon KENIKA SAHM is a 23 y.o. at [redacted]w[redacted]d here in MAU reporting: lower abdominal cramping with vaginal spotting for a couple of days.  LMP: 02/28/19 Onset of complaint: 2 days Pain score: 9 Vitals:   05/04/19 1859  BP: 120/87  Pulse: 86  Resp: 16  Temp: 98.9 F (37.2 C)      Lab orders placed from triage: UA

## 2019-05-04 NOTE — Telephone Encounter (Signed)
2nd attempt to reach pt. Unable to leave vm due to one no being set up. Salvatore Marvel, CMA

## 2019-05-04 NOTE — Telephone Encounter (Signed)
Pt is calling to check on the status of her FMLA paper work being completed. She said that it was faxed to our office on Monday. I informed her it was not in the doctors box so he may be in the process of completing the paper work.   Please call pt when paper work is complete.

## 2019-05-04 NOTE — MAU Provider Note (Addendum)
History     CSN: [id number]  Arrival date and time: 05/04/19 1807   First Provider Initiated Contact with Patient 05/04/19 2002      Chief Complaint  Patient presents with  . Vaginal Bleeding  . Abdominal Pain   HPI Linda Keith 22 y.o. [id number]  Comes to MAU today with lower abdominal cramping and some spotting when she wipes. Has not started prenatal care.  Last birth was on 01-13-19. Has not had any medical care in this pregnancy.  OB History    Gravida  3   Para  2   Term  2   Preterm      AB      Living  2     SAB      TAB      Ectopic      Multiple  0   Live Births  2           Past Medical History:  Diagnosis Date  . Anemia   . Depression    had pp depression which improved without meds  . Prenatal care 08/07/2014    [facility] Dating  LMP:05/06/18 Language  English Anatomy US   normal Flu Vaccine  08/02/18 Genetic Screen  Quad: WNL  TDaP vaccine    Hgb A1C or  GTT Early  Third trimester 112 Rhogam  NA   LAB RESULTS  Feeding Plan  Blood Type O/Positive/-- (09/04 1459) O+ Contraception  Antibody Negative (09/04 1459)Negative Circumcision  yes Rubella 4.26 (09/04 1459) 4.26 immune    Past Surgical History:  Procedure Laterality Date  . CESAREAN SECTION N/A 11/14/2014   Procedure: CESAREAN SECTION;  Surgeon: Catalina AntiguaPeggy Constant, MD;  Location: [facility];  Service: Obstetrics;  Laterality: N/A;  . NO PAST SURGERIES      Family History  Problem Relation Age of Onset  . Cardiomyopathy Mother   . Peripheral Artery Disease Mother     Social History   Tobacco Use  . Smoking status: Never Smoker  . Smokeless tobacco: Never Used  Substance Use Topics  . Alcohol use: No    Alcohol/week: 0.0 standard drinks  . Drug use: No    Allergies: No Known Allergies  Medications Prior to Admission  Medication Sig Dispense Refill Last Dose  . ibuprofen (ADVIL,MOTRIN) 600 MG tablet Take 1 tablet (600 mg total) by mouth every 6 (six)  hours. 30 tablet 0   . sertraline (ZOLOFT) 50 MG tablet Take 1 tablet (50 mg total) by mouth daily. 30 tablet 3   . [DISCONTINUED] Prenatal Vit-Fe Fumarate-FA (PRENATAL 19) tablet Chew 1 tablet by mouth daily. 30 tablet 8     Review of Systems  Constitutional: Negative for fever.  Respiratory: Negative for cough and shortness of breath.   Gastrointestinal: Positive for abdominal pain.  Genitourinary: Positive for vaginal bleeding. Negative for dysuria and vaginal discharge.   Physical Exam   Blood pressure 130/77, pulse 76, temperature 98.9 F (37.2 C), resp. rate 16, height 5\' 3"  (1.6 m), weight 74.8 kg, last menstrual period 02/28/2019, unknown if currently breastfeeding.  Physical Exam  Nursing note and vitals reviewed. Constitutional: She is oriented to person, place, and time. She appears well-developed and well-nourished.  HENT:  Head: Normocephalic.  Eyes: EOM are normal.  Neck: Neck supple.  GI: Soft. There is no abdominal tenderness. There is no rebound and no guarding.  Genitourinary:    Genitourinary Comments: Speculum exam: no discharge seen on vulva  Vagina - Small amount of white discharge, no odor, no blood seen Cervix - No contact bleeding Bimanual exam: Cervix closed Uterus non tender, unable to size well Adnexa non tender, no masses bilaterally GC/Chlam, wet prep done Chaperone present for exam.    Musculoskeletal: Normal range of motion.  Neurological: She is alert and oriented to person, place, and time.  Skin: Skin is warm and dry.  Psychiatric: She has a normal mood and affect.   Results for orders placed or performed during the hospital encounter of 05/04/19 (from the past 24 hour(s))  Urinalysis, Routine w reflex microscopic     Status: Abnormal   Collection Time: 05/04/19  7:34 PM  Result Value Ref Range   Color, Urine YELLOW YELLOW   APPearance CLEAR CLEAR   Specific Gravity, Urine 1.029 1.005 - 1.030   pH 5.0 5.0 - 8.0   Glucose, UA NEGATIVE  NEGATIVE mg/dL   Hgb urine dipstick NEGATIVE NEGATIVE   Bilirubin Urine NEGATIVE NEGATIVE   Ketones, ur 5 (A) NEGATIVE mg/dL   Protein, ur NEGATIVE NEGATIVE mg/dL   Nitrite NEGATIVE NEGATIVE   Leukocytes,Ua NEGATIVE NEGATIVE  CBC     Status: Abnormal   Collection Time: 05/04/19  7:39 PM  Result Value Ref Range   WBC 5.3 4.0 - 10.5 K/uL   RBC 5.04 3.87 - 5.11 MIL/uL   Hemoglobin 11.5 (L) 12.0 - 15.0 g/dL   HCT 36.8 36.0 - 46.0 %   MCV 73.0 (L) 80.0 - 100.0 fL   MCH 22.8 (L) 26.0 - 34.0 pg   MCHC 31.3 30.0 - 36.0 g/dL   RDW 15.8 (H) 11.5 - 15.5 %   Platelets 205 150 - 400 K/uL   nRBC 0.0 0.0 - 0.2 %  hCG, quantitative, pregnancy     Status: Abnormal   Collection Time: 05/04/19  7:39 PM  Result Value Ref Range   hCG, Beta Chain, Quant, S 33,308 (H) <5 mIU/mL  Wet prep, genital     Status: Abnormal   Collection Time: 05/04/19  8:13 PM   Specimen: Cervical/Vaginal swab  Result Value Ref Range   Yeast Wet Prep HPF POC NONE SEEN NONE SEEN   Trich, Wet Prep NONE SEEN NONE SEEN   Clue Cells Wet Prep HPF POC PRESENT (A) NONE SEEN   WBC, Wet Prep HPF POC MODERATE (A) NONE SEEN   Sperm NONE SEEN    US Ob Comp Less 14 Wks  Result Date: 05/04/2019 CLINICAL DATA:  Spotting and cramping x2 days EXAM: OBSTETRIC <14 WK [place] AND TRANSVAGINAL OB US TECHNIQUE: Both transabdominal and transvaginal ultrasound examinations were performed for complete evaluation of the gestation as well as the maternal uterus, adnexal regions, and pelvic cul-de-sac. Transvaginal technique was performed to assess early pregnancy. COMPARISON:  None. FINDINGS: Intrauterine gestational sac: Single Yolk sac:  Visualized. Embryo:  Visualized. Cardiac Activity: Visualized. Heart Rate: An accurate beats per minute could not be obtained. CRL:  2.2 mm   5 w   5 d                  [place] EDC: 12/30/2019 Subchorionic hemorrhage: There is a small amount of subchorionic hemorrhage measuring approximately 1.3 x 0.7 x 1.3 cm. Maternal  uterus/adnexae: No maternal abnormality detected. IMPRESSION: Single live IUP at 5 weeks and 5 days. While cardiac activity was visualized on the cine clips, an exact heart rate could not be obtained likely secondary to the small size of the embryo. Consider short interval repeat  ultrasound as clinically indicated for further evaluation. Electronically Signed   By: Katherine Mantlehristopher  Green M.D.   On: 05/04/2019 20:44   Care assumed by Fabian NovemberM Malley Hauter, CNM  At 2100 Nolene Bernheimerri Burleson, NP MAU Course  Procedures  MDM [facility], discussed findings with pt. Stable for discharge home.   Assessment and Plan   1. [redacted] weeks gestation of pregnancy   2. Vaginal bleeding in pregnancy, first trimester   3. Blood type, Rh positive   4. Subchorionic hematoma in first trimester, single or unspecified fetus    [facility] 4-5 weeks SAB/bleeding precautions  Rx PNV  Allergies as of 05/04/2019   No Known Allergies     Medication List    STOP taking these medications   ibuprofen 600 MG tablet Commonly known as: ADVIL     TAKE these medications   Prenatal 19 tablet Chew 1 tablet by mouth daily.   sertraline 50 MG tablet Commonly known as: ZOLOFT Take 1 tablet (50 mg total) by mouth daily.      Donette LarryBhambri, Ishanvi Mcquitty, CNM  05/04/2019 9:04 PM

## 2019-05-04 NOTE — Telephone Encounter (Signed)
Attempted to call pt. To inform her that we didn't receive any FMLA papers here at the office and the Doctor said that he didn't have any papers. The best thing for the pt to do is drop off the FMLA papers. Salvatore Marvel, CMA

## 2019-05-04 NOTE — Discharge Instructions (Signed)
Subchorionic Hematoma ° °A subchorionic hematoma is a gathering of blood between the outer wall of the embryo (chorion) and the inner wall of the womb (uterus). °This condition can cause vaginal bleeding. If they cause little or no vaginal bleeding, early small hematomas usually shrink on their own and do not affect your baby or pregnancy. When bleeding starts later in pregnancy, or if the hematoma is larger or occurs in older pregnant women, the condition may be more serious. Larger hematomas may get bigger, which increases the chances of miscarriage. This condition also increases the risk of: °· Premature separation of the placenta from the uterus. °· Premature (preterm) labor. °· Stillbirth. °What are the causes? °The exact cause of this condition is not known. It occurs when blood is trapped between the placenta and the uterine wall because the placenta has separated from the original site of implantation. °What increases the risk? °You are more likely to develop this condition if: °· You were treated with fertility medicines. °· You conceived through in vitro fertilization (IVF). °What are the signs or symptoms? °Symptoms of this condition include: °· Vaginal spotting or bleeding. °· Contractions of the uterus. These cause abdominal pain. °Sometimes you may have no symptoms and the bleeding may only be seen when ultrasound images are taken (transvaginal ultrasound). °How is this diagnosed? °This condition is diagnosed based on a physical exam. This includes a pelvic exam. You may also have other tests, including: °· Blood tests. °· Urine tests. °· Ultrasound of the abdomen. °How is this treated? °Treatment for this condition can vary. Treatment may include: °· Watchful waiting. You will be monitored closely for any changes in bleeding. During this stage: °? The hematoma may be reabsorbed by the body. °? The hematoma may separate the fluid-filled space containing the embryo (gestational sac) from the wall of the  womb (endometrium). °· Medicines. °· Activity restriction. This may be needed until the bleeding stops. °Follow these instructions at home: °· Stay on bed rest if told to do so by your health care provider. °· Do not lift anything that is heavier than 10 lbs. (4.5 kg) or as told by your health care provider. °· Do not use any products that contain nicotine or tobacco, such as cigarettes and e-cigarettes. If you need help quitting, ask your health care provider. °· Track and write down the number of pads you use each day and how soaked (saturated) they are. °· Do not use tampons. °· Keep all follow-up visits as told by your health care provider. This is important. Your health care provider may ask you to have follow-up blood tests or ultrasound tests or both. °Contact a health care provider if: °· You have any vaginal bleeding. °· You have a fever. °Get help right away if: °· You have severe cramps in your stomach, back, abdomen, or pelvis. °· You pass large clots or tissue. Save any tissue for your health care provider to look at. °· You have more vaginal bleeding, and you faint or become lightheaded or weak. °Summary °· A subchorionic hematoma is a gathering of blood between the outer wall of the placenta and the uterus. °· This condition can cause vaginal bleeding. °· Sometimes you may have no symptoms and the bleeding may only be seen when ultrasound images are taken. °· Treatment may include watchful waiting, medicines, or activity restriction. °This information is not intended to replace advice given to you by your health care provider. Make sure you discuss any questions you   have with your health care provider. °Document Released: 01/27/2007 Document Revised: 09/24/2017 Document Reviewed: 12/08/2016 °Elsevier Patient Education © 2020 [employer]vier Inc. ° °

## 2019-05-05 LAB — GC/CHLAMYDIA PROBE AMP (~~LOC~~) NOT AT ARMC
Chlamydia: NEGATIVE
Neisseria Gonorrhea: NEGATIVE

## 2019-05-08 ENCOUNTER — Encounter: Payer: Self-pay | Admitting: Family Medicine

## 2019-05-08 NOTE — Progress Notes (Signed)
Need to get repeat U/S to further visualize the heart of fetus.

## 2019-05-09 ENCOUNTER — Encounter: Payer: Medicaid Other | Admitting: Family Medicine

## 2019-05-09 ENCOUNTER — Encounter (HOSPITAL_COMMUNITY): Payer: Self-pay | Admitting: *Deleted

## 2019-05-09 ENCOUNTER — Inpatient Hospital Stay (HOSPITAL_COMMUNITY): Payer: Medicaid Other

## 2019-05-09 ENCOUNTER — Inpatient Hospital Stay (HOSPITAL_COMMUNITY)
Admission: AD | Admit: 2019-05-09 | Discharge: 2019-05-09 | Disposition: A | Discharge status: Home / Self Care | Payer: Medicaid Other | Attending: Obstetrics & Gynecology | Admitting: Obstetrics & Gynecology

## 2019-05-09 ENCOUNTER — Other Ambulatory Visit: Payer: Self-pay

## 2019-05-09 DIAGNOSIS — O208 Other hemorrhage in early pregnancy: Secondary | ICD-10-CM | POA: Insufficient documentation

## 2019-05-09 DIAGNOSIS — Z3491 Encounter for supervision of normal pregnancy, unspecified, first trimester: Secondary | ICD-10-CM

## 2019-05-09 DIAGNOSIS — Z3A01 Less than 8 weeks gestation of pregnancy: Secondary | ICD-10-CM | POA: Diagnosis not present

## 2019-05-09 DIAGNOSIS — O418X10 Other specified disorders of amniotic fluid and membranes, first trimester, not applicable or unspecified: Secondary | ICD-10-CM

## 2019-05-09 DIAGNOSIS — O468X1 Other antepartum hemorrhage, first trimester: Secondary | ICD-10-CM

## 2019-05-09 DIAGNOSIS — O209 Hemorrhage in early pregnancy, unspecified: Secondary | ICD-10-CM

## 2019-05-09 LAB — URINALYSIS, ROUTINE W REFLEX MICROSCOPIC
Bilirubin Urine: NEGATIVE
Glucose, UA: NEGATIVE mg/dL
Ketones, ur: 5 mg/dL — AB
Leukocytes,Ua: NEGATIVE
Nitrite: NEGATIVE
Protein, ur: 100 mg/dL — AB
RBC / HPF: >50 RBC/hpf — ABNORMAL HIGH (ref 0–5)
Specific Gravity, Urine: 1.03 (ref 1.005–1.030)
pH: 5 (ref 5.0–8.0)

## 2019-05-09 NOTE — MAU Provider Note (Addendum)
History     CSN: [id number]  Arrival date and time: 05/09/19 1925   First Provider Initiated Contact with Patient 05/09/19 2017      Chief Complaint  Patient presents with  . Abdominal Pain  . Vaginal Bleeding   HPI Linda Keith 23 y.o. [redacted]w[redacted]d  Was here on 05-04-19 for vaginal bleeding. Ultrasound showed IUP with small 1 cm subchorionic hemorrhage.  Tonight was at home and had vaginal bleeding more severe than a menstrual period and lower abdominal cramping more than menstrual cramping.  OB History    Gravida  3   Para  2   Term  2   Preterm      AB      Living  2     SAB      TAB      Ectopic      Multiple  0   Live Births  2           Past Medical History:  Diagnosis Date  . Anemia   . Depression    had pp depression which improved without meds  . Prenatal care 08/07/2014    Nursing Staff Provider Office Location  [facility] Dating  LMP:05/06/18 Language  English Anatomy US   normal Flu Vaccine  08/02/18 Genetic Screen  Quad: WNL  TDaP vaccine    Hgb A1C or  GTT Early  Third trimester 112 Rhogam  NA   LAB RESULTS  Feeding Plan  Blood Type O/Positive/-- (09/04 1459) O+ Contraception  Antibody Negative (09/04 1459)Negative Circumcision  yes Rubella 4.26 (09/04 1459) 4.26 immune    Past Surgical History:  Procedure Laterality Date  . CESAREAN SECTION N/A 11/14/2014   Procedure: CESAREAN SECTION;  Surgeon: Mora Bellman, MD;  Location: [facility];  Service: Obstetrics;  Laterality: N/A;  . NO PAST SURGERIES      Family History  Problem Relation Age of Onset  . Cardiomyopathy Mother   . Peripheral Artery Disease Mother     Social History   Tobacco Use  . Smoking status: Never Smoker  . Smokeless tobacco: Never Used  Substance Use Topics  . Alcohol use: No    Alcohol/week: 0.0 standard drinks  . Drug use: No    Allergies: No Known Allergies  Medications Prior to Admission  Medication Sig Dispense Refill Last Dose  . Prenatal Vit-Fe Fumarate-FA  (PRENATAL 19) tablet Chew 1 tablet by mouth daily. 30 tablet 11 05/09/2019 at Unknown time  . sertraline (ZOLOFT) 50 MG tablet Take 1 tablet (50 mg total) by mouth daily. 30 tablet 3 More than a month at Unknown time    Review of Systems  Constitutional: Negative for fever.  Respiratory: Negative for cough and shortness of breath.   Gastrointestinal: Positive for abdominal pain. Negative for diarrhea, nausea and vomiting.  Genitourinary: Positive for vaginal bleeding. Negative for dysuria and vaginal discharge.   Physical Exam   Blood pressure 121/75, pulse 89, temperature 99.4 F (37.4 C), temperature source Oral, resp. rate 18, last menstrual period 02/28/2019, unknown if currently breastfeeding.  Physical Exam  Nursing note and vitals reviewed. Constitutional: She is oriented to person, place, and time. She appears well-developed and well-nourished.  HENT:  Head: Normocephalic.  Eyes: EOM are normal.  Neck: Neck supple.  Cardiovascular: Normal rate.  Respiratory: Effort normal.  GI: Soft. There is no abdominal tenderness. There is no rebound and no guarding.  Musculoskeletal: Normal range of motion.  Neurological: She is alert and oriented to person,  place, and time.  Skin: Skin is warm and dry.  Psychiatric: She has a normal mood and affect.    MAU Course  Procedures Results for orders placed or performed during the hospital encounter of 05/09/19 (from the past 24 hour(s))  Urinalysis, Routine w reflex microscopic     Status: Abnormal   Collection Time: 05/09/19  7:50 PM  Result Value Ref Range   Color, Urine YELLOW YELLOW   APPearance HAZY (A) CLEAR   Specific Gravity, Urine 1.030 1.005 - 1.030   pH 5.0 5.0 - 8.0   Glucose, UA NEGATIVE NEGATIVE mg/dL   Hgb urine dipstick LARGE (A) NEGATIVE   Bilirubin Urine NEGATIVE NEGATIVE   Ketones, ur 5 (A) NEGATIVE mg/dL   Protein, ur 161100 (A) NEGATIVE mg/dL   Nitrite NEGATIVE NEGATIVE   Leukocytes,Ua NEGATIVE NEGATIVE   RBC /  HPF >50 (H) 0 - 5 RBC/hpf   WBC, UA 0-5 0 - 5 WBC/hpf   Bacteria, UA RARE (A) NONE SEEN   Squamous Epithelial / LPF 0-5 0 - 5   Mucus PRESENT    [place] Ob Transvaginal  Result Date: 05/09/2019 CLINICAL DATA:  Bleeding EXAM: TRANSVAGINAL OB ULTRASOUND TECHNIQUE: Transvaginal ultrasound was performed for complete evaluation of the gestation as well as the maternal uterus, adnexal regions, and pelvic cul-de-sac. COMPARISON:  05/04/2019 FINDINGS: Intrauterine gestational sac: Single Yolk sac:  Visualized. Embryo:  Visualized. Cardiac Activity: Visualized. Heart Rate: 121 bpm CRL: 5.9 mm   6 w 2 d                  US EDC: 12/31/2019 Subchorionic hemorrhage:  Small subchorionic hemorrhage again noted. Maternal uterus/adnexae: Left ovary is not identified. The right ovary is normal and measures 1.4 x 3.9 x 1.9 cm. No significant free fluid. IMPRESSION: 1. Single viable intrauterine pregnancy as above, now with documented fetal cardiac activity of 121 BPM 2. Small subchorionic hemorrhage Electronically Signed   By: Jasmine PangKim  Fujinaga M.D.   On: 05/09/2019 21:13     MDM Will order another ultrasound as bleeding and cramping is worse than on 05-04-19. Blood Type O positive Care assumed by Estanislado SpireE. Shamari Trostel, NP at 2100  Assessment and Plan    [name] 05/09/2019, 8:20 PM   Ultrasound shows IUP with cardiac activity in the 120s. [facility]  Discussed results with patient  A: 1. Normal IUP (intrauterine pregnancy) on prenatal ultrasound, first trimester   2. Vaginal bleeding in pregnancy, first trimester   3. Subchorionic hematoma in first trimester, single or unspecified fetus    P: Discharge home Discussed bleeding precautions Start prenatal care  Judeth HornLawrence, Kirstie Larsen, NP

## 2019-05-09 NOTE — MAU Note (Signed)
Pt reports to MAU c/o vaginal bleeding and abdominal cramping. Pt states she "felt wet" and when she went to the BR she had blood in her underwear and in the toilet. Pt reports cramping that started after the bleeding. Pt states it is a 8/10.

## 2019-05-09 NOTE — Discharge Instructions (Signed)
Subchorionic Hematoma ° °A subchorionic hematoma is a gathering of blood between the outer wall of the embryo (chorion) and the inner wall of the womb (uterus). °This condition can cause vaginal bleeding. If they cause little or no vaginal bleeding, early small hematomas usually shrink on their own and do not affect your baby or pregnancy. When bleeding starts later in pregnancy, or if the hematoma is larger or occurs in older pregnant women, the condition may be more serious. Larger hematomas may get bigger, which increases the chances of miscarriage. This condition also increases the risk of: °· Premature separation of the placenta from the uterus. °· Premature (preterm) labor. °· Stillbirth. °What are the causes? °The exact cause of this condition is not known. It occurs when blood is trapped between the placenta and the uterine wall because the placenta has separated from the original site of implantation. °What increases the risk? °You are more likely to develop this condition if: °· You were treated with fertility medicines. °· You conceived through in vitro fertilization (IVF). °What are the signs or symptoms? °Symptoms of this condition include: °· Vaginal spotting or bleeding. °· Contractions of the uterus. These cause abdominal pain. °Sometimes you may have no symptoms and the bleeding may only be seen when ultrasound images are taken (transvaginal ultrasound). °How is this diagnosed? °This condition is diagnosed based on a physical exam. This includes a pelvic exam. You may also have other tests, including: °· Blood tests. °· Urine tests. °· Ultrasound of the abdomen. °How is this treated? °Treatment for this condition can vary. Treatment may include: °· Watchful waiting. You will be monitored closely for any changes in bleeding. During this stage: °? The hematoma may be reabsorbed by the body. °? The hematoma may separate the fluid-filled space containing the embryo (gestational sac) from the wall of the  womb (endometrium). °· Medicines. °· Activity restriction. This may be needed until the bleeding stops. °Follow these instructions at home: °· Stay on bed rest if told to do so by your health care provider. °· Do not lift anything that is heavier than 10 lbs. (4.5 kg) or as told by your health care provider. °· Do not use any products that contain nicotine or tobacco, such as cigarettes and e-cigarettes. If you need help quitting, ask your health care provider. °· Track and write down the number of pads you use each day and how soaked (saturated) they are. °· Do not use tampons. °· Keep all follow-up visits as told by your health care provider. This is important. Your health care provider may ask you to have follow-up blood tests or ultrasound tests or both. °Contact a health care provider if: °· You have any vaginal bleeding. °· You have a fever. °Get help right away if: °· You have severe cramps in your stomach, back, abdomen, or pelvis. °· You pass large clots or tissue. Save any tissue for your health care provider to look at. °· You have more vaginal bleeding, and you faint or become lightheaded or weak. °Summary °· A subchorionic hematoma is a gathering of blood between the outer wall of the placenta and the uterus. °· This condition can cause vaginal bleeding. °· Sometimes you may have no symptoms and the bleeding may only be seen when ultrasound images are taken. °· Treatment may include watchful waiting, medicines, or activity restriction. °This information is not intended to replace advice given to you by your health care provider. Make sure you discuss any questions you   have with your health care provider. °Document Released: 01/27/2007 Document Revised: 09/24/2017 Document Reviewed: 12/08/2016 °Elsevier Patient Education © 2020 [employer]vier Inc. ° °

## 2019-05-11 ENCOUNTER — Encounter: Payer: Self-pay | Admitting: *Deleted

## 2019-05-22 ENCOUNTER — Telehealth: Payer: Self-pay | Admitting: Family Medicine

## 2019-05-22 NOTE — Telephone Encounter (Signed)
Recompleted FMLA and placed in fax pile to be sent to [facility]

## 2019-05-22 NOTE — Telephone Encounter (Signed)
Received multiple faxes from [employer] requesting FMLA forms be faxed in for patient. These forms were completed and placed at the front desk for patient. Called patient to confirm if she received her papers, but only got a busy tone on number.

## 2019-06-01 ENCOUNTER — Encounter: Payer: Medicaid Other | Admitting: Family Medicine

## 2019-07-04 ENCOUNTER — Telehealth: Payer: Self-pay

## 2019-07-04 ENCOUNTER — Ambulatory Visit (INDEPENDENT_AMBULATORY_CARE_PROVIDER_SITE_OTHER): Payer: Medicaid Other | Admitting: Family Medicine

## 2019-07-04 ENCOUNTER — Encounter: Payer: Self-pay | Admitting: Family Medicine

## 2019-07-04 ENCOUNTER — Other Ambulatory Visit: Payer: Self-pay

## 2019-07-04 VITALS — BP 110/60 | HR 81 | Wt 164.1 lb

## 2019-07-04 DIAGNOSIS — O99019 Anemia complicating pregnancy, unspecified trimester: Secondary | ICD-10-CM

## 2019-07-04 DIAGNOSIS — Z3492 Encounter for supervision of normal pregnancy, unspecified, second trimester: Secondary | ICD-10-CM

## 2019-07-04 DIAGNOSIS — O99012 Anemia complicating pregnancy, second trimester: Secondary | ICD-10-CM

## 2019-07-04 DIAGNOSIS — Z3A14 14 weeks gestation of pregnancy: Secondary | ICD-10-CM

## 2019-07-04 NOTE — Patient Instructions (Signed)

## 2019-07-04 NOTE — Progress Notes (Signed)
Linda Keith is a 23 y.o. G3P2002 at [redacted]w[redacted]d here for routine follow up.  She reports no bleeding, no contractions, no cramping and no leaking. See flow sheet for details.  A/P: Pregnancy at [redacted]w[redacted]d.  Doing well.   Pregnancy issues include previous vaginal bleeding in first trimester, found to have subchorionic hemorrhage. Was seen at MAU. Reports no other issues.  Needs to have prenatal labs drawn.  - OB panel, OB Urine Cx, Hg Solubility w/ Reflex Anatomy ultrasound ordered to be scheduled at 18-19 weeks. Patient is not interested in genetic screening. Bleeding and pain precautions reviewed. Follow up [facility] weeks at [facility] clinic.

## 2019-07-04 NOTE — Progress Notes (Addendum)
Patient needs genetic history, pelvic exam, PHQ9. Called patient to come in for separate appointment in two week for this, but phone line was busy.

## 2019-07-04 NOTE — Telephone Encounter (Signed)
Attempted to reach pt on cell phone. Number was busy. I also called pts mother cell. I LVM for pt to call us back. Regarding her appt for today at 1:50p. To see if she needed to reschedule it or not? per note that was left.

## 2019-07-04 NOTE — Addendum Note (Signed)
Addended by: Josephine Igo B on: 07/04/2019 03:17 PM   Modules accepted: Orders

## 2019-07-05 ENCOUNTER — Encounter: Payer: Self-pay | Admitting: *Deleted

## 2019-07-05 LAB — OBSTETRIC PANEL, INCLUDING HIV
Antibody Screen: NEGATIVE
Basophils Absolute: 0 10*3/uL (ref 0.0–0.2)
Basos: 0 %
EOS (ABSOLUTE): 0.1 10*3/uL (ref 0.0–0.4)
Eos: 1 %
HIV Screen 4th Generation wRfx: NONREACTIVE
Hematocrit: 32.9 % — ABNORMAL LOW (ref 34.0–46.6)
Hemoglobin: 10.4 g/dL — ABNORMAL LOW (ref 11.1–15.9)
Hepatitis B Surface Ag: NEGATIVE
Immature Grans (Abs): 0 10*3/uL (ref 0.0–0.1)
Immature Granulocytes: 0 %
Lymphocytes Absolute: 1.3 10*3/uL (ref 0.7–3.1)
Lymphs: 27 %
MCH: 23.5 pg — ABNORMAL LOW (ref 26.6–33.0)
MCHC: 31.6 g/dL (ref 31.5–35.7)
MCV: 74 fL — ABNORMAL LOW (ref 79–97)
Monocytes Absolute: 0.4 10*3/uL (ref 0.1–0.9)
Monocytes: 8 %
Neutrophils Absolute: 3 10*3/uL (ref 1.4–7.0)
Neutrophils: 64 %
Platelets: 227 10*3/uL (ref 150–450)
RBC: 4.43 x10E6/uL (ref 3.77–5.28)
RDW: 15.2 % (ref 11.7–15.4)
RPR Ser Ql: NONREACTIVE
Rh Factor: POSITIVE
Rubella Antibodies, IGG: 4.31 index (ref >0.99)
WBC: 4.9 10*3/uL (ref 3.4–10.8)

## 2019-07-05 LAB — HGB SOLU + RFLX FRAC: Sickle Solubility Test - HGBRFX: NEGATIVE

## 2019-07-05 MED ORDER — IRON 325 (65 FE) MG PO TABS: 1.0000 | ORAL_TABLET | Freq: Every day | ORAL | 0 refills | Status: DC

## 2019-07-05 NOTE — Addendum Note (Signed)
Addended by: Josephine Igo B on: 07/05/2019 09:29 AM   Modules accepted: Orders

## 2019-07-06 LAB — URINE CULTURE, OB REFLEX

## 2019-07-06 LAB — CULTURE, OB URINE

## 2019-07-07 ENCOUNTER — Encounter: Payer: Self-pay | Admitting: Family Medicine

## 2019-07-07 DIAGNOSIS — O99019 Anemia complicating pregnancy, unspecified trimester: Secondary | ICD-10-CM | POA: Insufficient documentation

## 2019-07-12 ENCOUNTER — Other Ambulatory Visit: Payer: Self-pay | Admitting: *Deleted

## 2019-07-12 DIAGNOSIS — Z3492 Encounter for supervision of normal pregnancy, unspecified, second trimester: Secondary | ICD-10-CM

## 2019-07-13 ENCOUNTER — Ambulatory Visit (HOSPITAL_COMMUNITY): Admission: RE | Admit: 2019-07-13 | Payer: Medicaid Other | Source: Ambulatory Visit

## 2019-07-13 ENCOUNTER — Ambulatory Visit (HOSPITAL_COMMUNITY): Payer: Medicaid Other

## 2019-07-19 ENCOUNTER — Encounter: Payer: Self-pay | Admitting: Family Medicine

## 2019-07-19 ENCOUNTER — Other Ambulatory Visit (HOSPITAL_COMMUNITY): Payer: Self-pay | Admitting: *Deleted

## 2019-07-19 ENCOUNTER — Other Ambulatory Visit: Payer: Self-pay

## 2019-07-19 ENCOUNTER — Ambulatory Visit (HOSPITAL_COMMUNITY)
Admission: RE | Admit: 2019-07-19 | Discharge: 2019-07-19 | Disposition: A | Discharge status: Home / Self Care | Payer: Medicaid Other | Source: Ambulatory Visit | Attending: Obstetrics and Gynecology | Admitting: Obstetrics and Gynecology

## 2019-07-19 DIAGNOSIS — O34219 Maternal care for unspecified type scar from previous cesarean delivery: Secondary | ICD-10-CM | POA: Diagnosis not present

## 2019-07-19 DIAGNOSIS — Z3492 Encounter for supervision of normal pregnancy, unspecified, second trimester: Secondary | ICD-10-CM | POA: Diagnosis present

## 2019-07-19 DIAGNOSIS — Z363 Encounter for antenatal screening for malformations: Secondary | ICD-10-CM | POA: Diagnosis not present

## 2019-07-19 DIAGNOSIS — O09892 Supervision of other high risk pregnancies, second trimester: Secondary | ICD-10-CM | POA: Diagnosis not present

## 2019-07-19 DIAGNOSIS — Z362 Encounter for other antenatal screening follow-up: Secondary | ICD-10-CM

## 2019-07-19 DIAGNOSIS — Z3A16 16 weeks gestation of pregnancy: Secondary | ICD-10-CM

## 2019-08-16 ENCOUNTER — Other Ambulatory Visit: Payer: Self-pay

## 2019-08-16 ENCOUNTER — Ambulatory Visit (HOSPITAL_COMMUNITY)
Admission: RE | Admit: 2019-08-16 | Discharge: 2019-08-16 | Disposition: A | Discharge status: Home / Self Care | Payer: Medicaid Other | Source: Ambulatory Visit | Attending: Obstetrics | Admitting: Obstetrics

## 2019-08-16 DIAGNOSIS — Z362 Encounter for other antenatal screening follow-up: Secondary | ICD-10-CM | POA: Diagnosis present

## 2019-08-16 DIAGNOSIS — Z3A20 20 weeks gestation of pregnancy: Secondary | ICD-10-CM | POA: Diagnosis not present

## 2019-08-16 DIAGNOSIS — O09892 Supervision of other high risk pregnancies, second trimester: Secondary | ICD-10-CM

## 2019-08-16 DIAGNOSIS — O34219 Maternal care for unspecified type scar from previous cesarean delivery: Secondary | ICD-10-CM | POA: Diagnosis not present

## 2019-10-27 NOTE — L&D Delivery Note (Signed)
OB/GYN Faculty Practice Delivery Note  Linda Keith is a 24 y.o. [id number] s/p NSVD at [redacted]w[redacted]d. She was admitted for active labor and vaginal bleeding.   ROM: 0h 32m with clear fluid GBS Status: negative Maximum Maternal Temperature: 98.3*F  Labor Progress: She was admitted in active labor with vaginal bleeding. She progressed to completely dilated and felt the urge to push. She was AROMed and delivered shortly thereafter.  Delivery Date/Time: 12/15/19, 2230 Delivery: Called to room and patient was complete and pushing. Head delivered LOA. No nuchal cord present. Shoulder and body delivered in usual fashion followed swiftly by the placenta. Infant with spontaneous cry, placed on mother's abdomen, dried and stimulated. Cord clamped x 2 and cut; delayed cord clamping was not done. Cord blood drawn. Fundus firm with massage and IM Pitocin. Labia, perineum, vagina, and cervix were inspected, and a right periurethral abrasion was noted- hemostatic and not in need of repair.   Placenta: intact, via Duncan mechanism, 3 vessel cord, to pathology for abruption Complications: Placental abruption Lacerations: right periurethral abrasion- hemostatic and not in need of repair EBL: 265 mL Analgesia: None  Postpartum Planning [x]  message to sent to schedule follow-up  [x]  vaccines UTD  Infant: female  APGARs 9,9  weight per medical record  , DO OB/GYN Fellow, Faculty Practice

## 2019-11-16 ENCOUNTER — Other Ambulatory Visit: Payer: Self-pay

## 2019-11-16 ENCOUNTER — Ambulatory Visit (INDEPENDENT_AMBULATORY_CARE_PROVIDER_SITE_OTHER): Payer: Medicaid Other | Admitting: Student in an Organized Health Care Education/Training Program

## 2019-11-16 VITALS — BP 115/65 | HR 97 | Wt 181.2 lb

## 2019-11-16 DIAGNOSIS — Z23 Encounter for immunization: Secondary | ICD-10-CM | POA: Diagnosis not present

## 2019-11-16 DIAGNOSIS — Z3493 Encounter for supervision of normal pregnancy, unspecified, third trimester: Secondary | ICD-10-CM | POA: Diagnosis present

## 2019-11-16 DIAGNOSIS — Z3A33 33 weeks gestation of pregnancy: Secondary | ICD-10-CM

## 2019-11-16 LAB — POCT 1 HR PRENATAL GLUCOSE: Glucose 1 Hr Prenatal, POC: 142 mg/dL

## 2019-11-16 NOTE — Progress Notes (Signed)
Linda Keith is a 24 y.o. G3P2002 at [redacted]w[redacted]d for routine follow up.  She reports feeling well. She has missed her last several routine visits as she has been worried about COVID and spreading to her two other young children. She reports fetal movement. She denies vaginal bleeding, contractions, or loss of fluid.   See flow sheet for details.  O: Blood pressure 115/65, pulse 97, weight 181 lb 3.2 oz (82.2 kg), last menstrual period 02/28/2019, unknown if currently breastfeeding.  General: NAD, pleasant, able to participate in exam Cardiac: RRR, normal heart sounds, no murmurs. 2+ radial and PT pulses bilaterally Respiratory: CTAB, normal effort, No wheezes, rales or rhonchi Abdomen: fundal height 33.5cm, FHR ~135 Pelvic: negative for ulcers. Positive for cervical white lesions. Non tender to exam. Excessive thick white/mucoid discharge in vaginal canal with some green tint from cervix.  Extremities: no edema or cyanosis. WWP. Skin: warm and dry, no rashes noted Neuro: alert and oriented x4, no focal deficits Psych: Normal affect and mood   A/P: Pregnancy at [redacted]w[redacted]d.  Doing well.   Pregnancy issues include poor prenatal follow up.   Infant circumcision desired not applicable (female infant)  Tdapwas given today. GBS/GC/CZ testing was performed today. 1hr glucola test given today. Referred for VBAC consent. (mon 8:30) Scheduled for OB clinic. PHQ-9 and medical home form completed.  3rd trimester labs collected in cluding CBC, RPR, HIV.   Preterm labor precautions reviewed. Safe sleep discussed. Kick counts reviewed. Follow up at [facility] clinic and women[facility] clinic next week.

## 2019-11-16 NOTE — Addendum Note (Signed)
Addended by: Jennette Bill on: 11/16/2019 12:11 PM   Modules accepted: Orders

## 2019-11-16 NOTE — Addendum Note (Signed)
Addended by: Leeroy Bock on: 11/16/2019 12:08 PM   Modules accepted: Orders

## 2019-11-16 NOTE — Addendum Note (Signed)
Addended by: Gilberto Better R on: 11/16/2019 12:31 PM   Modules accepted: Orders

## 2019-11-16 NOTE — Patient Instructions (Signed)
It was a pleasure to see you today!  To summarize our discussion for this visit:  Thank you for coming in today. We are on the way to getting all the prenatal to-do list up to date to ensure a healthy mom and baby.   Your babys heart rate and size is measuring well today.   You have a follow up OB clinic visit scheduled at our clinic.   You have a follow up appointment for VBAC consent scheduled [facility] Monday at 8:30.  We have collected vaginal swab and blood today to screen for some common pregnancy problems and will keep you updated if any abnormalities come up.    Please return to our clinic to see [place] at your next [facility] clinic visit.  Call the clinic at [phone] if your symptoms worsen or you have any concerns.   Thank you for allowing me to take part in your care,  Dr. Jamelle Rushing   Third Trimester of Pregnancy  The third trimester is from week 28 through week 40 (months 7 through 9). This trimester is when your unborn baby (fetus) is growing very fast. At the end of the ninth month, the unborn baby is about 20 inches in length. It weighs about 6-10 pounds. Follow these instructions at home: Medicines  Take over-the-counter and prescription medicines only as told by your doctor. Some medicines are safe and some medicines are not safe during pregnancy.  Take a prenatal vitamin that contains at least 600 micrograms (mcg) of folic acid.  If you have trouble pooping (constipation), take medicine that will make your stool soft (stool softener) if your doctor approves. Eating and drinking   Eat regular, healthy meals.  Avoid raw meat and uncooked cheese.  If you get low calcium from the food you eat, talk to your doctor about taking a daily calcium supplement.  Eat four or five small meals rather than three large meals a day.  Avoid foods that are high in fat and sugars, such as fried and sweet foods.  To prevent constipation: ? Eat  foods that are high in fiber, like fresh fruits and vegetables, whole grains, and beans. ? Drink enough fluids to keep your pee (urine) clear or pale yellow. Activity  Exercise only as told by your doctor. Stop exercising if you start to have cramps.  Avoid heavy lifting, wear low heels, and sit up straight.  Do not exercise if it is too hot, too humid, or if you are in a place of great height (high altitude).  You may continue to have sex unless your doctor tells you not to. Relieving pain and discomfort  Wear a good support bra if your breasts are tender.  Take frequent breaks and rest with your legs raised if you have leg cramps or low back pain.  Take warm water baths (sitz baths) to soothe pain or discomfort caused by hemorrhoids. Use hemorrhoid cream if your doctor approves.  If you develop puffy, bulging veins (varicose veins) in your legs: ? Wear support hose or compression stockings as told by your doctor. ? Raise (elevate) your feet for 15 minutes, 3-4 times a day. ? Limit salt in your food. Safety  Wear your seat belt when driving.  Make a list of emergency phone numbers, including numbers for family, friends, the hospital, and police and fire departments. Preparing for your baby's arrival To prepare for the arrival of your baby:  Take prenatal classes.  Practice driving  to the hospital.  Visit the hospital and tour the maternity area.  Talk to your work about taking leave once the baby comes.  Pack your hospital bag.  Prepare the baby's room.  Go to your doctor visits.  Buy a rear-facing car seat. Learn how to install it in your car. General instructions  Do not use hot tubs, steam rooms, or saunas.  Do not use any products that contain nicotine or tobacco, such as cigarettes and e-cigarettes. If you need help quitting, ask your doctor.  Do not drink alcohol.  Do not douche or use tampons or scented sanitary pads.  Do not cross your legs for long  periods of time.  Do not travel for long distances unless you must. Only do so if your doctor says it is okay.  Visit your dentist if you have not gone during your pregnancy. Use a soft toothbrush to brush your teeth. Be gentle when you floss.  Avoid cat litter boxes and soil used by cats. These carry germs that can cause birth defects in the baby and can cause a loss of your baby (miscarriage) or stillbirth.  Keep all your prenatal visits as told by your doctor. This is important. Contact a doctor if:  You are not sure if you are in labor or if your water has broken.  You are dizzy.  You have mild cramps or pressure in your lower belly.  You have a nagging pain in your belly area.  You continue to feel sick to your stomach, you throw up, or you have watery poop.  You have bad smelling fluid coming from your vagina.  You have pain when you pee. Get help right away if:  You have a fever.  You are leaking fluid from your vagina.  You are spotting or bleeding from your vagina.  You have severe belly cramps or pain.  You lose or gain weight quickly.  You have trouble catching your breath and have chest pain.  You notice sudden or extreme puffiness (swelling) of your face, hands, ankles, feet, or legs.  You have not felt the baby move in over an hour.  You have severe headaches that do not go away with medicine.  You have trouble seeing.  You are leaking, or you are having a gush of fluid, from your vagina before you are 37 weeks.  You have regular belly spasms (contractions) before you are 37 weeks. Summary  The third trimester is from week 28 through week 40 (months 7 through 9). This time is when your unborn baby is growing very fast.  Follow your doctor's advice about medicine, food, and activity.  Get ready for the arrival of your baby by taking prenatal classes, getting all the baby items ready, preparing the baby's room, and visiting your doctor to be  checked.  Get help right away if you are bleeding from your vagina, or you have chest pain and trouble catching your breath, or if you have not felt your baby move in over an hour. This information is not intended to replace advice given to you by your health care provider. Make sure you discuss any questions you have with your health care provider. Document Revised: 02/02/2019 Document Reviewed: 11/17/2016 Elsevier Patient Education  [place].

## 2019-11-17 LAB — CERVICOVAGINAL ANCILLARY ONLY
Chlamydia: NEGATIVE
Comment: NEGATIVE
Comment: NORMAL
Neisseria Gonorrhea: NEGATIVE

## 2019-11-18 LAB — CBC
Hematocrit: 29.5 % — ABNORMAL LOW (ref 34.0–46.6)
Hemoglobin: 9.5 g/dL — ABNORMAL LOW (ref 11.1–15.9)
MCH: 24.2 pg — ABNORMAL LOW (ref 26.6–33.0)
MCHC: 32.2 g/dL (ref 31.5–35.7)
MCV: 75 fL — ABNORMAL LOW (ref 79–97)
Platelets: 165 10*3/uL (ref 150–450)
RBC: 3.92 x10E6/uL (ref 3.77–5.28)
RDW: 14.3 % (ref 11.7–15.4)
WBC: 4.5 10*3/uL (ref 3.4–10.8)

## 2019-11-18 LAB — GLUCOSE TOLERANCE, 1 HOUR: Glucose, 1Hr PP: 124 mg/dL (ref 65–199)

## 2019-11-18 LAB — RPR: RPR Ser Ql: NONREACTIVE

## 2019-11-18 LAB — HIV ANTIBODY (ROUTINE TESTING W REFLEX): HIV Screen 4th Generation wRfx: NONREACTIVE

## 2019-11-20 ENCOUNTER — Ambulatory Visit (INDEPENDENT_AMBULATORY_CARE_PROVIDER_SITE_OTHER): Payer: Medicaid Other | Admitting: Family Medicine

## 2019-11-20 ENCOUNTER — Other Ambulatory Visit: Payer: Self-pay

## 2019-11-20 VITALS — BP 122/69 | HR 85 | Wt 182.6 lb

## 2019-11-20 DIAGNOSIS — Z3A34 34 weeks gestation of pregnancy: Secondary | ICD-10-CM

## 2019-11-20 DIAGNOSIS — O0933 Supervision of pregnancy with insufficient antenatal care, third trimester: Secondary | ICD-10-CM | POA: Diagnosis not present

## 2019-11-20 DIAGNOSIS — Z3493 Encounter for supervision of normal pregnancy, unspecified, third trimester: Secondary | ICD-10-CM

## 2019-11-20 DIAGNOSIS — O0932 Supervision of pregnancy with insufficient antenatal care, second trimester: Secondary | ICD-10-CM

## 2019-11-20 DIAGNOSIS — O34219 Maternal care for unspecified type scar from previous cesarean delivery: Secondary | ICD-10-CM | POA: Diagnosis not present

## 2019-11-20 NOTE — Progress Notes (Signed)
   PRENATAL VISIT NOTE  Subjective:  Linda Keith is a 23 y.o. G3P2002 at [redacted]w[redacted]d being seen today for consult due to previous cesarean delivery. She had a cesarean for arrest of descent, followed by a successful VBAC with her second pregnancy. She would like to have another VBAC. Pregnancy is also complicated by limited prenatal care.   Patient reports no complaints.  Contractions: Irritability. Vag. Bleeding: None.  Movement: Present. Denies leaking of fluid.   The following portions of the patient's history were reviewed and updated as appropriate: allergies, current medications, past family history, past medical history, past social history, past surgical history and problem list.   Objective:   Vitals:   11/20/19 0938  BP: 122/69  Pulse: 85  Weight: 182 lb 9.6 oz (82.8 kg)    Fetal Status: Fetal Heart Rate (bpm): 142   Movement: Present     General:  Alert, oriented and cooperative. Patient is in no acute distress.  Skin: Skin is warm and dry. No rash noted.   Cardiovascular: Normal heart rate noted  Respiratory: Normal respiratory effort, no problems with respiration noted  Abdomen: Soft, gravid, appropriate for gestational age.  Pain/Pressure: Absent     Pelvic: Cervical exam deferred        Extremities: Normal range of motion.  Edema: Trace  Mental Status: Normal mood and affect. Normal behavior. Normal judgment and thought content.   Assessment and Plan:  Pregnancy: G3P2002 at [redacted]w[redacted]d 1. Encounter for supervision of low-risk pregnancy in third trimester FHT and FH normal. She will continue to have prenatal care at the family medicine office and is already scheduled there. There is no reason why she shouldn't have care there. Patient encouraged to keep remainder of appointments and advised that will likely have GBS cultures done at that appointment. Follow up via MyChart with any questions encouraged.  2. Previous cesarean delivery affecting pregnancy, antepartum Discussed  VBAC, risks, benefits. Patient would like to have rpt VBAC  3. VBAC (vaginal birth after Cesarean)  4. Insufficient prenatal care in second trimester   Preterm labor symptoms and general obstetric precautions including but not limited to vaginal bleeding, contractions, leaking of fluid and fetal movement were reviewed in detail with the patient. Please refer to After Visit Summary for other counseling recommendations.   Return if symptoms worsen or fail to improve.  Future Appointments  Date Time Provider Department Center  11/22/2019  8:30 AM [facility]  11/30/2019 11:00 AM [facility] OB [facility]    Levie Heritage, DO

## 2019-11-22 ENCOUNTER — Other Ambulatory Visit: Payer: Self-pay

## 2019-11-22 ENCOUNTER — Other Ambulatory Visit (INDEPENDENT_AMBULATORY_CARE_PROVIDER_SITE_OTHER): Payer: Medicaid Other

## 2019-11-22 DIAGNOSIS — Z3493 Encounter for supervision of normal pregnancy, unspecified, third trimester: Secondary | ICD-10-CM | POA: Diagnosis present

## 2019-11-22 LAB — POCT CBG (FASTING - GLUCOSE)-MANUAL ENTRY: Glucose Fasting, POC: 92 mg/dL (ref 70–99)

## 2019-11-23 LAB — GESTATIONAL GLUCOSE TOLERANCE
Glucose, Fasting: 78 mg/dL (ref 65–94)
Glucose, GTT - 1 Hour: 119 mg/dL (ref 65–179)
Glucose, GTT - 2 Hour: 79 mg/dL (ref 65–154)
Glucose, GTT - 3 Hour: 94 mg/dL (ref 65–139)

## 2019-11-29 ENCOUNTER — Encounter: Payer: Self-pay | Admitting: Family Medicine

## 2019-11-30 ENCOUNTER — Other Ambulatory Visit: Payer: Self-pay

## 2019-11-30 ENCOUNTER — Encounter: Payer: Self-pay | Admitting: Family Medicine

## 2019-11-30 ENCOUNTER — Ambulatory Visit (INDEPENDENT_AMBULATORY_CARE_PROVIDER_SITE_OTHER): Payer: Medicaid Other | Admitting: Family Medicine

## 2019-11-30 VITALS — BP 110/62 | HR 85 | Wt 183.6 lb

## 2019-11-30 DIAGNOSIS — Z23 Encounter for immunization: Secondary | ICD-10-CM | POA: Diagnosis not present

## 2019-11-30 DIAGNOSIS — Z3493 Encounter for supervision of normal pregnancy, unspecified, third trimester: Secondary | ICD-10-CM

## 2019-11-30 DIAGNOSIS — O09893 Supervision of other high risk pregnancies, third trimester: Secondary | ICD-10-CM

## 2019-11-30 HISTORY — DX: Supervision of other high risk pregnancies, third trimester: O09.893

## 2019-11-30 MED ORDER — POLYETHYLENE GLYCOL 3350 17 GM/SCOOP PO POWD: 17.0000 g | Freq: Two times a day (BID) | ORAL | 1 refills | Status: AC | PRN

## 2019-11-30 NOTE — Progress Notes (Signed)
Linda Keith is a 24 y.o. G3P2002 at [redacted]w[redacted]d for routine follow up.  Linda Keith reports no issues at this time. Linda Keith reports fetal movement. Linda Keith denies vaginal bleeding or loss of fluid. Reports irregular contractions.   See flow sheet for details.  Vitals:   11/30/19 1109  BP: 110/62  Pulse: 85     A/P: Pregnancy at [redacted]w[redacted]d.  Doing well.   Pregnancy issues include short interval pregnancy.   Infant feeding choice bottle  Contraception choice Nexplanon  Infant circumcision desired not applicable  Tdapwas not given today. Has already received this pregnancy.  GBS testing was performed today.  Preterm labor precautions reviewed. Safe sleep discussed. Kick counts reviewed. Follow up in 1 week

## 2019-11-30 NOTE — Patient Instructions (Signed)
It was a pleasure to meet you today. Your and your baby look wonderful! Today we collected a GBS test. We will call you with those results. I am going to resend your prescription for iron as well as send in some Miralax to your pharmacy. If you have any questions or concerns please call the clinic.    Third Trimester of Pregnancy  The third trimester is from week 28 through week 40 (months 7 through 9). This trimester is when your unborn baby (fetus) is growing very fast. At the end of the ninth month, the unborn baby is about 20 inches in length. It weighs about 6-10 pounds. Follow these instructions at home: Medicines  Take over-the-counter and prescription medicines only as told by your doctor. Some medicines are safe and some medicines are not safe during pregnancy.  Take a prenatal vitamin that contains at least 600 micrograms (mcg) of folic acid.  If you have trouble pooping (constipation), take medicine that will make your stool soft (stool softener) if your doctor approves. Eating and drinking   Eat regular, healthy meals.  Avoid raw meat and uncooked cheese.  If you get low calcium from the food you eat, talk to your doctor about taking a daily calcium supplement.  Eat four or five small meals rather than three large meals a day.  Avoid foods that are high in fat and sugars, such as fried and sweet foods.  To prevent constipation: ? Eat foods that are high in fiber, like fresh fruits and vegetables, whole grains, and beans. ? Drink enough fluids to keep your pee (urine) clear or pale yellow. Activity  Exercise only as told by your doctor. Stop exercising if you start to have cramps.  Avoid heavy lifting, wear low heels, and sit up straight.  Do not exercise if it is too hot, too humid, or if you are in a place of great height (high altitude).  You may continue to have sex unless your doctor tells you not to. Relieving pain and discomfort  Wear a good support bra if  your breasts are tender.  Take frequent breaks and rest with your legs raised if you have leg cramps or low back pain.  Take warm water baths (sitz baths) to soothe pain or discomfort caused by hemorrhoids. Use hemorrhoid cream if your doctor approves.  If you develop puffy, bulging veins (varicose veins) in your legs: ? Wear support hose or compression stockings as told by your doctor. ? Raise (elevate) your feet for 15 minutes, 3-4 times a day. ? Limit salt in your food. Safety  Wear your seat belt when driving.  Make a list of emergency phone numbers, including numbers for family, friends, the hospital, and police and fire departments. Preparing for your baby's arrival To prepare for the arrival of your baby:  Take prenatal classes.  Practice driving to the hospital.  Visit the hospital and tour the maternity area.  Talk to your work about taking leave once the baby comes.  Pack your hospital bag.  Prepare the baby's room.  Go to your doctor visits.  Buy a rear-facing car seat. Learn how to install it in your car. General instructions  Do not use hot tubs, steam rooms, or saunas.  Do not use any products that contain nicotine or tobacco, such as cigarettes and e-cigarettes. If you need help quitting, ask your doctor.  Do not drink alcohol.  Do not douche or use tampons or scented sanitary pads.  Do not  cross your legs for long periods of time.  Do not travel for long distances unless you must. Only do so if your doctor says it is okay.  Visit your dentist if you have not gone during your pregnancy. Use a soft toothbrush to brush your teeth. Be gentle when you floss.  Avoid cat litter boxes and soil used by cats. These carry germs that can cause birth defects in the baby and can cause a loss of your baby (miscarriage) or stillbirth.  Keep all your prenatal visits as told by your doctor. This is important. Contact a doctor if:  You are not sure if you are in  labor or if your water has broken.  You are dizzy.  You have mild cramps or pressure in your lower belly.  You have a nagging pain in your belly area.  You continue to feel sick to your stomach, you throw up, or you have watery poop.  You have bad smelling fluid coming from your vagina.  You have pain when you pee. Get help right away if:  You have a fever.  You are leaking fluid from your vagina.  You are spotting or bleeding from your vagina.  You have severe belly cramps or pain.  You lose or gain weight quickly.  You have trouble catching your breath and have chest pain.  You notice sudden or extreme puffiness (swelling) of your face, hands, ankles, feet, or legs.  You have not felt the baby move in over an hour.  You have severe headaches that do not go away with medicine.  You have trouble seeing.  You are leaking, or you are having a gush of fluid, from your vagina before you are 37 weeks.  You have regular belly spasms (contractions) before you are 37 weeks. Summary  The third trimester is from week 28 through week 40 (months 7 through 9). This time is when your unborn baby is growing very fast.  Follow your doctor's advice about medicine, food, and activity.  Get ready for the arrival of your baby by taking prenatal classes, getting all the baby items ready, preparing the baby's room, and visiting your doctor to be checked.  Get help right away if you are bleeding from your vagina, or you have chest pain and trouble catching your breath, or if you have not felt your baby move in over an hour. This information is not intended to replace advice given to you by your health care provider. Make sure you discuss any questions you have with your health care provider. Document Revised: 02/02/2019 Document Reviewed: 11/17/2016 Elsevier Patient Education  [place].

## 2019-11-30 NOTE — Progress Notes (Signed)
Patient has anemia of pregnancy, ferritin low (24). She will start taking iron. Recommend ferritin and CBC at follow up in 2 weeks. She has not been taking iron secondary to constipation. Rx for miralax given, discussed.  Vertex on examination today. GBS collected with chaperone.   Received influenza today.  Infection/genetic tab completed.  PHQ was 2 today, not taking SSRI at this time. Recommend monitoring for symptoms.  Of note, she has two early ultrasounds that are 1 day apart. First is at [redacted]w[redacted]d with CRL and fetal cardiac activity documented, EDD 12/30/2019. Second 1 week later, EDD of 12/31/2019. Will keep first ultrasound as dating ultrasound.   I discussed the plan of care with the resident physician and agree with below documentation.  Terisa Starr, MD

## 2019-12-04 LAB — CULTURE, BETA STREP (GROUP B ONLY): Strep Gp B Culture: NEGATIVE

## 2019-12-07 ENCOUNTER — Ambulatory Visit (INDEPENDENT_AMBULATORY_CARE_PROVIDER_SITE_OTHER): Payer: Medicaid Other | Admitting: Family Medicine

## 2019-12-07 ENCOUNTER — Other Ambulatory Visit: Payer: Self-pay

## 2019-12-07 VITALS — BP 110/74 | HR 73 | Wt 183.0 lb

## 2019-12-07 DIAGNOSIS — O09893 Supervision of other high risk pregnancies, third trimester: Secondary | ICD-10-CM | POA: Diagnosis not present

## 2019-12-07 DIAGNOSIS — O99019 Anemia complicating pregnancy, unspecified trimester: Secondary | ICD-10-CM | POA: Diagnosis not present

## 2019-12-07 DIAGNOSIS — Z3493 Encounter for supervision of normal pregnancy, unspecified, third trimester: Secondary | ICD-10-CM

## 2019-12-07 NOTE — Progress Notes (Signed)
Linda Keith is a 24 y.o. G3P2002 at [redacted]w[redacted]d here for routine follow up. She is dated by early ultrasound.  She reports no complaints. She reports fetal movement. She denies vaginal bleeding, contractions, or loss of fluid. See flow sheet for details.  Has been taking iron every day, no constipation.  Vitals:   12/07/19 1030  BP: 110/74  Pulse: 73    A/P: Pregnancy at [redacted]w[redacted]d.  Doing well.   . Dating reviewed, dating tab is correct, verified with last note by Dr. Manson Passey. . Fetal heart tones Appropriate . Fundal height within expected range.  . Fetal position confirmed Vertex using Leopold's .  Marland Kitchen Pregnancy issues include anemia, hx C/S with VBAC during last pregnancy, seen by Dr. Emelda Fear for VBAC consent. . Problem list updated: Yes . GBS not collected today due to previous collection, was negative. .  . Repeat GC/CT not collected today due to negative on 11/16/19.  The patient does not have a history of HSV.   Marland Kitchen Infant feeding choice: Formula feeding  . Contraception choice: Nexplanon  . Infant circumcision desired not applicable  . Influenza vaccine previously administered.   . Tdap previously administered between 27-36 weeks    Discussed with OB fellow Dr. Salomon Mast regarding patient's early gonorrhea and Chlamydia check.  She notes that given patient denies intercourse since then and has no vaginal complaints, can use this as a negative.  Anemia in pregnancy: Has been taking ferrous sulfate daily.  Will recheck CBC and ferritin today.  Pregnancy education regarding preterm labor, fetal movement,  benefits of breastfeeding, contraception, fetal growth, expected weight gain, and safe infant sleep were discussed.  Follow up 1 week.   Luis Abed, D.O.  PGY-2 Family Medicine  12/07/2019 10:58 AM

## 2019-12-07 NOTE — Patient Instructions (Addendum)
Third Trimester of Pregnancy  The third trimester is from week 28 through week 40 (months 7 through 9). This trimester is when your unborn baby (fetus) is growing very fast. At the end of the ninth month, the unborn baby is about 20 inches in length. It weighs about 6-10 pounds. Follow these instructions at home: Medicines  Take over-the-counter and prescription medicines only as told by your doctor. Some medicines are safe and some medicines are not safe during pregnancy.  Take a prenatal vitamin that contains at least 600 micrograms (mcg) of folic acid.  If you have trouble pooping (constipation), take medicine that will make your stool soft (stool softener) if your doctor approves. Eating and drinking   Eat regular, healthy meals.  Avoid raw meat and uncooked cheese.  If you get low calcium from the food you eat, talk to your doctor about taking a daily calcium supplement.  Eat four or five small meals rather than three large meals a day.  Avoid foods that are high in fat and sugars, such as fried and sweet foods.  To prevent constipation: ? Eat foods that are high in fiber, like fresh fruits and vegetables, whole grains, and beans. ? Drink enough fluids to keep your pee (urine) clear or pale yellow. Activity  Exercise only as told by your doctor. Stop exercising if you start to have cramps.  Avoid heavy lifting, wear low heels, and sit up straight.  Do not exercise if it is too hot, too humid, or if you are in a place of great height (high altitude).  You may continue to have sex unless your doctor tells you not to. Relieving pain and discomfort  Wear a good support bra if your breasts are tender.  Take frequent breaks and rest with your legs raised if you have leg cramps or low back pain.  Take warm water baths (sitz baths) to soothe pain or discomfort caused by hemorrhoids. Use hemorrhoid cream if your doctor approves.  If you develop puffy, bulging veins (varicose  veins) in your legs: ? Wear support hose or compression stockings as told by your doctor. ? Raise (elevate) your feet for 15 minutes, 3-4 times a day. ? Limit salt in your food. Safety  Wear your seat belt when driving.  Make a list of emergency phone numbers, including numbers for family, friends, the hospital, and police and fire departments. Preparing for your baby's arrival To prepare for the arrival of your baby:  Take prenatal classes.  Practice driving to the hospital.  Visit the hospital and tour the maternity area.  Talk to your work about taking leave once the baby comes.  Pack your hospital bag.  Prepare the baby's room.  Go to your doctor visits.  Buy a rear-facing car seat. Learn how to install it in your car. General instructions  Do not use hot tubs, steam rooms, or saunas.  Do not use any products that contain nicotine or tobacco, such as cigarettes and e-cigarettes. If you need help quitting, ask your doctor.  Do not drink alcohol.  Do not douche or use tampons or scented sanitary pads.  Do not cross your legs for long periods of time.  Do not travel for long distances unless you must. Only do so if your doctor says it is okay.  Visit your dentist if you have not gone during your pregnancy. Use a soft toothbrush to brush your teeth. Be gentle when you floss.  Avoid cat litter boxes and soil   used by cats. These carry germs that can cause birth defects in the baby and can cause a loss of your baby (miscarriage) or stillbirth.  Keep all your prenatal visits as told by your doctor. This is important. Contact a doctor if:  You are not sure if you are in labor or if your water has broken.  You are dizzy.  You have mild cramps or pressure in your lower belly.  You have a nagging pain in your belly area.  You continue to feel sick to your stomach, you throw up, or you have watery poop.  You have bad smelling fluid coming from your vagina.  You have  pain when you pee. Get help right away if:  You have a fever.  You are leaking fluid from your vagina.  You are spotting or bleeding from your vagina.  You have severe belly cramps or pain.  You lose or gain weight quickly.  You have trouble catching your breath and have chest pain.  You notice sudden or extreme puffiness (swelling) of your face, hands, ankles, feet, or legs.  You have not felt the baby move in over an hour.  You have severe headaches that do not go away with medicine.  You have trouble seeing.  You are leaking, or you are having a gush of fluid, from your vagina before you are 37 weeks.  You have regular belly spasms (contractions) before you are 37 weeks. Summary  The third trimester is from week 28 through week 40 (months 7 through 9). This time is when your unborn baby is growing very fast.  Follow your doctor's advice about medicine, food, and activity.  Get ready for the arrival of your baby by taking prenatal classes, getting all the baby items ready, preparing the baby's room, and visiting your doctor to be checked.  Get help right away if you are bleeding from your vagina, or you have chest pain and trouble catching your breath, or if you have not felt your baby move in over an hour. This information is not intended to replace advice given to you by your health care provider. Make sure you discuss any questions you have with your health care provider. Document Revised: 02/02/2019 Document Reviewed: 11/17/2016 Elsevier Patient Education  2020 [employer]  [employer]  This circuit takes at least 90 minutes to complete so clear your schedule and make mental preparations so you can relax in your environment. The second step requires a lot of pillows so gather them up before beginning Before starting, you should empty your bladder! Have a nice drink nearby, and make sure it has a straw! If you are having contractions, this circuit should  be done through contractions, try not to change positions between steps Before you begin...  "I named this 'circuit' after my friend Megan Miles, who shared and discussed it with me when I was working with a client whose labor seemed to be stalled out and no longer progressing... This circuit is useful to help get the baby lined up, ideally, in the "Left Occiput Anterior" (LOA) Position, both before labor begins and when some corrections need to be done during labor. Prenatally, this position set can help to rotate a baby. As a natural method of induction, this can help get things going if baby just needed a gentle nudge of position to set things off. To the best of my knowledge, this group of positions will not "hurt" a baby that is already lined up   correctly." - Sharon Muza   Step One: Open-knee Chest Stay in this position for 30 minutes, start in cat/cow, then drop your chest as low as you can to the bed or the floor and your bottom as high as you can. Knees should be fairly wide apart, and the angle between the torso/thighs should be wider than 90 degrees. Wiggle around, prop with lots of pillows and use this time to get totally relaxed. This position allows the baby to scoot out of the pelvis a bit and gives them room to rotate, shift their head position, etc. If the pregnant person finds it helpful, careful positioning with a rebozo under the belly, with gentle tension from a support person behind can help maintain this position for the full 30 minutes.  Step Two:Exaggerated Left Side Lying Roll to your left side, bringing your top leg as high as possible and keeping your bottom leg straight. Roll forward as much as possible, again using a lot of pillows. Sink into the bed and relax some more. If you fall asleep, that's totally okay and you can stay there! If not, stay here for at least another half an hour. Try and get your top right leg up towards your head and get as  rolled over onto your belly as much as possible. If you repeat the circuit during labor, try alternating left and right sides. We know the photo the left is actually right side... just flip the image in your head.  Step Three: Moving and Lunges Lunge, walk stairs facing sideways, 2 at a time, (have a spotter downstairs of you!), take a walk outside with one foot on the curb and the other on the street, sit on a birth ball and hula- anything that's upright and putting your pelvis in open, asymmetrical positions. Spend at least 30 minutes doing this one as well to give your baby a chance to move down. If you are lunging or stair or curb walking, you should lunge/walk/go up stairs in the direction that feels better to you. The key with the lunge is that the toes of the higher leg and mom's belly button should be at right angles. Do not lunge over your knee, that closes the pelvis.     Megan Hamilton Miles: Circuit Creator - [email or web address] Sharon Muza, CD, BDT (DONA), LCCE, FACCE: Supporting Content - [email or web address] Emily Weaver Brown: Photography - [email or web address] Kate Dewey CD/CDT (BAI): Print and Webmaster - [email or web address] [employer] [email or web address]  

## 2019-12-08 LAB — CBC
Hematocrit: 32.6 % — ABNORMAL LOW (ref 34.0–46.6)
Hemoglobin: 10.1 g/dL — ABNORMAL LOW (ref 11.1–15.9)
MCH: 23.9 pg — ABNORMAL LOW (ref 26.6–33.0)
MCHC: 31 g/dL — ABNORMAL LOW (ref 31.5–35.7)
MCV: 77 fL — ABNORMAL LOW (ref 79–97)
Platelets: 173 10*3/uL (ref 150–450)
RBC: 4.23 x10E6/uL (ref 3.77–5.28)
RDW: 14.1 % (ref 11.7–15.4)
WBC: 4.3 10*3/uL (ref 3.4–10.8)

## 2019-12-08 LAB — FERRITIN: Ferritin: 17 ng/mL (ref 15–150)

## 2019-12-11 ENCOUNTER — Encounter (HOSPITAL_COMMUNITY): Payer: Self-pay | Admitting: Obstetrics & Gynecology

## 2019-12-11 ENCOUNTER — Encounter: Payer: Self-pay | Admitting: General Practice

## 2019-12-11 ENCOUNTER — Other Ambulatory Visit: Payer: Self-pay

## 2019-12-11 ENCOUNTER — Inpatient Hospital Stay (HOSPITAL_COMMUNITY)
Admission: AD | Admit: 2019-12-11 | Discharge: 2019-12-11 | Disposition: A | Discharge status: Home / Self Care | Payer: Medicaid Other | Attending: Obstetrics & Gynecology | Admitting: Obstetrics & Gynecology

## 2019-12-11 DIAGNOSIS — O212 Late vomiting of pregnancy: Secondary | ICD-10-CM | POA: Diagnosis not present

## 2019-12-11 DIAGNOSIS — Z3689 Encounter for other specified antenatal screening: Secondary | ICD-10-CM | POA: Diagnosis present

## 2019-12-11 DIAGNOSIS — O471 False labor at or after 37 completed weeks of gestation: Secondary | ICD-10-CM | POA: Insufficient documentation

## 2019-12-11 DIAGNOSIS — Z3A37 37 weeks gestation of pregnancy: Secondary | ICD-10-CM | POA: Diagnosis not present

## 2019-12-11 NOTE — MAU Note (Signed)
Patient presents to MAU c/o of ctx q4-16m and vomiting. +FM, denies LOF or vaginal bleeding other than spotting earlier today, but none now.

## 2019-12-11 NOTE — MAU Provider Note (Signed)
S: [name] is a 24 y.o. G3P2002 at [redacted]w[redacted]d  who presents to MAU today complaining contractions q 4-5 minutes with one episode of vomiting. She endorses one episode of vaginal spotting earlier today, but denies recurrent spotting. Also denies overt vaginal bleeding. She denies LOF. She reports normal fetal movement.    O: BP 119/74   Pulse 81   Temp 98.8 F (37.1 C) (Oral)   Resp 17   Ht 5\' 3"  (1.6 m)   Wt 82.2 kg   LMP 02/28/2019   SpO2 99%   BMI 32.12 kg/m  GENERAL: Well-developed, well-nourished female in no acute distress.  HEAD: Normocephalic, atraumatic.  CHEST: Normal effort of breathing, regular heart rate ABDOMEN: Soft, nontender, gravid  Cervical exam:  Dilation: 3 Effacement (%): 70, 80 Cervical Position: Posterior Station: Ballotable Presentation: Vertex Exam by:: [email or web address], RN   Fetal Monitoring: Baseline: 140 Variability: Mod Accelerations: Pos 15 x 15 Decelerations: None Contractions: Occasional   A: SIUP at [redacted]w[redacted]d  Cervix remains 3/70/ballotable after one hour of evaluation Normotensive Cat I tracing  P: Discharge home in stable condition with labor precautions Next Appt [facility] 12/14/2019  12/16/2019, CNM 12/11/2019 10:31 PM

## 2019-12-11 NOTE — Discharge Instructions (Signed)

## 2019-12-14 ENCOUNTER — Encounter: Payer: Medicaid Other | Admitting: Family Medicine

## 2019-12-15 ENCOUNTER — Other Ambulatory Visit: Payer: Self-pay

## 2019-12-15 ENCOUNTER — Encounter (HOSPITAL_COMMUNITY): Payer: Self-pay | Admitting: Obstetrics & Gynecology

## 2019-12-15 ENCOUNTER — Inpatient Hospital Stay (HOSPITAL_COMMUNITY)
Admission: AD | Admit: 2019-12-15 | Discharge: 2019-12-17 | DRG: 807 | Disposition: A | Discharge status: Home / Self Care | Payer: Medicaid Other | Attending: Obstetrics & Gynecology | Admitting: Obstetrics & Gynecology

## 2019-12-15 DIAGNOSIS — O09893 Supervision of other high risk pregnancies, third trimester: Secondary | ICD-10-CM

## 2019-12-15 DIAGNOSIS — Z30430 Encounter for insertion of intrauterine contraceptive device: Secondary | ICD-10-CM | POA: Diagnosis not present

## 2019-12-15 DIAGNOSIS — O4590 Premature separation of placenta, unspecified, unspecified trimester: Secondary | ICD-10-CM

## 2019-12-15 DIAGNOSIS — Z3A37 37 weeks gestation of pregnancy: Secondary | ICD-10-CM

## 2019-12-15 DIAGNOSIS — O4593 Premature separation of placenta, unspecified, third trimester: Secondary | ICD-10-CM | POA: Diagnosis present

## 2019-12-15 DIAGNOSIS — Z20822 Contact with and (suspected) exposure to covid-19: Secondary | ICD-10-CM | POA: Diagnosis present

## 2019-12-15 DIAGNOSIS — Z3046 Encounter for surveillance of implantable subdermal contraceptive: Secondary | ICD-10-CM | POA: Diagnosis not present

## 2019-12-15 DIAGNOSIS — O34219 Maternal care for unspecified type scar from previous cesarean delivery: Secondary | ICD-10-CM | POA: Diagnosis present

## 2019-12-15 DIAGNOSIS — Z30017 Encounter for initial prescription of implantable subdermal contraceptive: Secondary | ICD-10-CM | POA: Diagnosis not present

## 2019-12-15 DIAGNOSIS — O4693 Antepartum hemorrhage, unspecified, third trimester: Secondary | ICD-10-CM | POA: Diagnosis present

## 2019-12-15 HISTORY — DX: Premature separation of placenta, unspecified, unspecified trimester: O45.90

## 2019-12-15 LAB — CBC
HCT: 36.1 % (ref 36.0–46.0)
Hemoglobin: 11 g/dL — ABNORMAL LOW (ref 12.0–15.0)
MCH: 23.6 pg — ABNORMAL LOW (ref 26.0–34.0)
MCHC: 30.5 g/dL (ref 30.0–36.0)
MCV: 77.3 fL — ABNORMAL LOW (ref 80.0–100.0)
Platelets: 154 10*3/uL (ref 150–400)
RBC: 4.67 MIL/uL (ref 3.87–5.11)
RDW: 15.1 % (ref 11.5–15.5)
WBC: 6.9 10*3/uL (ref 4.0–10.5)
nRBC: 0 % (ref 0.0–0.2)

## 2019-12-15 LAB — RESPIRATORY PANEL BY RT PCR (FLU A&B, COVID)
Influenza A by PCR: NEGATIVE
Influenza B by PCR: NEGATIVE
SARS Coronavirus 2 by RT PCR: NEGATIVE

## 2019-12-15 LAB — TYPE AND SCREEN
ABO/RH(D): O POS
Antibody Screen: NEGATIVE

## 2019-12-15 MED ORDER — LACTATED RINGERS IV SOLN: INTRAVENOUS | Status: DC

## 2019-12-15 MED ORDER — OXYTOCIN 40 UNITS IN NORMAL SALINE INFUSION - SIMPLE MED
2.5000 [IU]/h | INTRAVENOUS | Status: DC
  Filled 2019-12-15: qty 1000

## 2019-12-15 MED ORDER — OXYCODONE-ACETAMINOPHEN 5-325 MG PO TABS: 2.0000 | ORAL_TABLET | ORAL | Status: DC | PRN

## 2019-12-15 MED ORDER — FENTANYL-BUPIVACAINE-NACL 0.5-0.125-0.9 MG/250ML-% EP SOLN
12.0000 mL/h | EPIDURAL | Status: DC | PRN
  Filled 2019-12-15: qty 250

## 2019-12-15 MED ORDER — EPHEDRINE 5 MG/ML INJ: 10.0000 mg | INTRAVENOUS | Status: DC | PRN

## 2019-12-15 MED ORDER — PHENYLEPHRINE 40 MCG/ML (10ML) SYRINGE FOR IV PUSH (FOR BLOOD PRESSURE SUPPORT): 80.0000 ug | PREFILLED_SYRINGE | INTRAVENOUS | Status: DC | PRN

## 2019-12-15 MED ORDER — LACTATED RINGERS IV SOLN: 500.0000 mL | Freq: Once | INTRAVENOUS | Status: DC

## 2019-12-15 MED ORDER — OXYTOCIN 10 UNIT/ML IJ SOLN
INTRAMUSCULAR | Status: AC
  Administered 2019-12-15: 10 [IU]
  Filled 2019-12-15: qty 1

## 2019-12-15 MED ORDER — OXYTOCIN BOLUS FROM INFUSION: 500.0000 mL | Freq: Once | INTRAVENOUS | Status: DC

## 2019-12-15 MED ORDER — OXYCODONE-ACETAMINOPHEN 5-325 MG PO TABS: 1.0000 | ORAL_TABLET | ORAL | Status: DC | PRN

## 2019-12-15 MED ORDER — DIPHENHYDRAMINE HCL 50 MG/ML IJ SOLN: 12.5000 mg | INTRAMUSCULAR | Status: DC | PRN

## 2019-12-15 MED ORDER — ACETAMINOPHEN 325 MG PO TABS
650.0000 mg | ORAL_TABLET | ORAL | Status: DC | PRN
  Administered 2019-12-15: 650 mg via ORAL
  Filled 2019-12-15: qty 2

## 2019-12-15 MED ORDER — LIDOCAINE HCL (PF) 1 % IJ SOLN: 30.0000 mL | INTRAMUSCULAR | Status: DC | PRN

## 2019-12-15 MED ORDER — SOD CITRATE-CITRIC ACID 500-334 MG/5ML PO SOLN: 30.0000 mL | ORAL | Status: DC | PRN

## 2019-12-15 MED ORDER — ONDANSETRON HCL 4 MG/2ML IJ SOLN
4.0000 mg | Freq: Four times a day (QID) | INTRAMUSCULAR | Status: DC | PRN
  Administered 2019-12-15: 4 mg via INTRAVENOUS
  Filled 2019-12-15: qty 2

## 2019-12-15 MED ORDER — LACTATED RINGERS IV SOLN: 500.0000 mL | INTRAVENOUS | Status: DC | PRN

## 2019-12-15 NOTE — H&P (Signed)
Linda Keith is a 24 y.o. female presenting for painful regular contractions and vaginal bleeding. There is no leaking fluid.  She has hx C/S x1, followed by successful VBAC.   She plans Nexplanon in the hospital for contraception.  Nursing Staff Provider  Office Location  [facility] Dating  [place] 12/30/2019  Language  English  Anatomy US  Normal- female   Flu Vaccine  11/30/19 Genetic Screen  Declined     TDaP vaccine   11/16/19 Hgb A1C or  GTT Elevated 1 hour, normal 3 hour   Rhogam  NA- O POs    LAB RESULTS   Feeding Plan Breast  Blood Type O/Positive/-- (09/08 1447)   Contraception Uncertain- possibly IUD vs nexplanon  Antibody Negative (09/08 1447)  Circumcision Female  Rubella 4.31 (09/08 1447)  Pediatrician   RPR Non Reactive (01/21 1129)   Support Person  HBsAg Negative (09/08 1447)   Prenatal Classes  HIV Non Reactive (01/21 1129)  BTL Consent  GBS Negative/-- (03/23 1236) Negative   VBAC Consent  Pap 09/2018 WNL     Hgb Electro  Negative sickle cell screen   BP Cuff  CF     SMA     Waterbirth  [ ]  Class [ ]  Consent [ ]  CNM visit    OB History    Gravida  3   Para  2   Term  2   Preterm      AB      Living  2     SAB      TAB      Ectopic      Multiple  0   Live Births  2          Past Medical History:  Diagnosis Date  . Anemia   . Depression    had pp depression which improved without meds  . Prenatal care 08/07/2014    Nursing Staff Provider Office Location  [facility] Dating  LMP:05/06/18 Language  English Anatomy US   normal Flu Vaccine  08/02/18 Genetic Screen  Quad: WNL  TDaP vaccine    Hgb A1C or  GTT Early  Third trimester 112 Rhogam  NA   LAB RESULTS  Feeding Plan  Blood Type O/Positive/-- (09/04 1459) O+ Contraception  Antibody Negative (09/04 1459)Negative Circumcision  yes Rubella 4.26 (09/04 1459) 4.26 immune   Past Surgical History:  Procedure Laterality Date  . CESAREAN SECTION N/A 11/14/2014   Procedure: CESAREAN SECTION;  Surgeon: Mora Bellman, MD;  Location: [facility];  Service: Obstetrics;  Laterality: N/A;   Family History: family history includes Cardiomyopathy in her mother; Peripheral Artery Disease in her mother. Social History:  reports that she has never smoked. She has never used smokeless tobacco. She reports that she does not drink alcohol or use drugs.     Maternal Diabetes: No Genetic Screening: Declined Maternal Ultrasounds/Referrals: Normal Fetal Ultrasounds or other Referrals:  None Maternal Substance Abuse:  No Significant Maternal Medications:  None Significant Maternal Lab Results:  Group B Strep negative Other Comments:  None  Review of Systems  Constitutional: Negative for chills, fatigue and fever.  Eyes: Negative for visual disturbance.  Respiratory: Negative for shortness of breath.   Cardiovascular: Negative for chest pain.  Gastrointestinal: Positive for abdominal pain. Negative for vomiting.  Genitourinary: Positive for vaginal bleeding. Negative for difficulty urinating, dysuria, flank pain, pelvic pain, vaginal discharge and vaginal pain.  Neurological: Negative for dizziness and headaches.  Psychiatric/Behavioral: Negative.  Maternal Medical History:  Reason for admission: Contractions and vaginal bleeding.   Contractions: Onset was 3-5 hours ago.   Frequency: regular.   Perceived severity is strong.    Fetal activity: Perceived fetal activity is normal.   Last perceived fetal movement was within the past hour.    Prenatal complications: no prenatal complications Prenatal Complications - Diabetes: none.    Dilation: 4 Effacement (%): 100 Exam by:: Camelia Eng RN/ Sharen Counter CNM Blood pressure 130/79, pulse 94, resp. rate 20, last menstrual period 02/28/2019, unknown if currently breastfeeding. Maternal Exam:  Uterine Assessment: Contraction strength is moderate.  Contraction frequency is regular.   Abdomen: Surgical scars: low transverse.   Fetal  presentation: vertex  Introitus: Large amount dark red bleeding with 1 large clot in vaginal vault, 3 fox swabs used to visualize cervix  Pelvis: adequate for delivery.   Cervix: Cervix evaluated by sterile speculum exam and digital exam.     Fetal Exam Fetal Monitor Review: Mode: ultrasound.   Baseline rate: 135.  Variability: moderate (6-25 bpm).   Pattern: accelerations present and no decelerations.    Fetal State Assessment: Category I - tracings are normal.     Physical Exam  Nursing note and vitals reviewed. Constitutional: She is oriented to person, place, and time. She appears well-developed and well-nourished.  Cardiovascular: Normal rate, regular rhythm and normal heart sounds.  Respiratory: Effort normal.  GI: Soft.  Musculoskeletal:        General: Normal range of motion.     Cervical back: Normal range of motion.  Neurological: She is alert and oriented to person, place, and time.  Skin: Skin is warm and dry.  Psychiatric: She has a normal mood and affect. Her behavior is normal. Judgment and thought content normal.    Prenatal labs: ABO, Rh: O/Positive/-- (09/08 1447) Antibody: Negative (09/08 1447) Rubella: 4.31 (09/08 1447) RPR: Non Reactive (01/21 1129)  HBsAg: Negative (09/08 1447)  HIV: Non Reactive (01/21 1129)  GBS: Negative/-- (02/04 1215)   Assessment/Plan: [id number] at [redacted]w[redacted]d with active labor at term Vaginal bleeding GBS negative  Admit to L&D Pad counts for bleeding Continuous EFM Anticipate successful VBAC  Sharen Counter 12/15/2019, 9:42 PM

## 2019-12-15 NOTE — MAU Note (Signed)
Pt here with complaints of contractions that started about 1 hour ago. Unsure how close they are, but "are very close together". She denies LOF. Reports vaginal bleeding. Good fetal movement. Cervix was 3cm on last exam.

## 2019-12-15 NOTE — Discharge Summary (Addendum)
Postpartum Discharge Summary     Patient Name: Linda Keith DOB: 1996-08-06 MRN: [id number]  Date of admission: 12/15/2019 Delivering Provider: Merilyn Baba   Date of discharge: 12/17/2019  Admitting diagnosis: Normal labor [O80, Z37.9] Intrauterine pregnancy: [redacted]w[redacted]d    Secondary diagnosis:  Active Problems:   Previous cesarean delivery affecting pregnancy, antepartum   Normal labor   VBAC (vaginal birth after Cesarean)   Short interval between pregnancies affecting pregnancy in third trimester, antepartum   Placental abruption  Additional problems: None     Discharge diagnosis: Term Pregnancy Delivered, VBAC and placental abruption                                                                                                 Post partum procedures:  Nexplanon  placed 12/16/2019  Augmentation: AROM  Complications: Placental Abruption  Hospital course:  Onset of Labor With Vaginal Delivery     24y.o. yo [id number] [id number] admitted in Active Labor on 12/15/2019. Patient's labor course as follows:   She was admitted in active labor with vaginal bleeding. She progressed to completely dilated and felt the urge to push. She was AROMed and delivered shortly thereafter.  Membrane Rupture Time/Date: 10:30 PM ,12/15/2019   Intrapartum Procedures: Episiotomy: None [1]                                         Lacerations:  None [1]  Patient had a delivery of a Viable infant. 12/15/2019  Information for the patient's newborn:  Linda Keith, Linda Keith [[id number]]Delivery Method: VBAC, Spontaneous(Filed from Delivery Summary)     Pateint had an uncomplicated postpartum course.  She is ambulating, tolerating a regular diet, passing flatus, and urinating well. Patient is discharged home in stable condition on 12/17/19.  Delivery time: 10:30 PM    Magnesium Sulfate received: No BMZ received: No Rhophylac:N/A MMR:N/A Transfusion:No  Physical exam  Vitals:   12/16/19  0822 12/16/19 1519 12/16/19 2045 12/17/19 0604  BP: 102/70 120/81 110/64 95/62  Pulse: 68 73 67 83  Resp: 20 20 20 20   Temp: 98.2 F (36.8 C) 98.3 F (36.8 C) 97.6 F (36.4 C) 97.6 F (36.4 C)  TempSrc: Oral Oral Oral Oral  SpO2: 99% 99% 100% 100%  Weight:      Height:       General: alert, cooperative and no distress Lochia: appropriate Uterine Fundus: firm Incision: N/A DVT Evaluation: No evidence of DVT seen on physical exam. No significant calf/ankle edema. Labs: Lab Results  Component Value Date   WBC 10.3 12/16/2019   HGB 9.5 (L) 12/16/2019   HCT 30.0 (L) 12/16/2019   MCV 75.8 (L) 12/16/2019   PLT 143 (L) 12/16/2019   CMP Latest Ref Rng & Units 01/22/2019  Glucose 70 - 99 mg/dL 89  BUN 6 - 20 mg/dL 14  Creatinine 0.44 - 1.00 mg/dL 0.67  Sodium 135 - 145 mmol/L 134(L)  Potassium 3.5 - 5.1 mmol/L 3.9  Chloride 98 - 111 mmol/L 109  CO2 22 - 32 mmol/L 18(L)  Calcium 8.9 - 10.3 mg/dL 8.9  Total Protein 6.5 - 8.1 g/dL 6.4(L)  Total Bilirubin 0.3 - 1.2 mg/dL 0.9  Alkaline Phos 38 - 126 U/L 106  AST 15 - 41 U/L 20  ALT 0 - 44 U/L 15   Edinburgh Score: Edinburgh Postnatal Depression Scale Screening Tool 12/16/2019  I have been able to laugh and see the funny side of things. (No Data)  I have looked forward with enjoyment to things. -  I have blamed myself unnecessarily when things went wrong. -  I have been anxious or worried for no good reason. -  I have felt scared or panicky for no good reason. -  Things have been getting on top of me. -  I have been so unhappy that I have had difficulty sleeping. -  I have felt sad or miserable. -  I have been so unhappy that I have been crying. -  The thought of harming myself has occurred to me. Flavia Shipper Postnatal Depression Scale Total -    Discharge instruction: per After Visit Summary and "Baby and Me Booklet".  After visit meds:  Allergies as of 12/17/2019   No Known Allergies      Medication List     TAKE  these medications    Iron 325 (65 Fe) MG Tabs Take 1 tablet (325 mg total) by mouth daily.   polyethylene glycol powder 17 GM/SCOOP powder Commonly known as: GLYCOLAX/MIRALAX Take 17 g by mouth 2 (two) times daily as needed for moderate constipation.   Prenatal 19 tablet Chew 1 tablet by mouth daily.   sertraline 50 MG tablet Commonly known as: ZOLOFT Take 1 tablet (50 mg total) by mouth daily.        Diet: routine diet  Activity: Advance as tolerated. Pelvic rest for 6 weeks.   Outpatient follow up:4 weeks Follow up Appt:No future appointments. Follow up Visit:  Please schedule this patient for Postpartum visit in: 4 weeks with the following provider: Any provider For C/S patients schedule nurse incision check in weeks 2 weeks: no Low risk pregnancy complicated by: H/o Cesarean delivery with successful VBAC Delivery mode:  SVD Anticipated Birth Control:  Nexplanon PP Procedures needed: None  Schedule Integrated BH visit: no  Newborn Data: Live born female  Birth Weight:  2637 g APGAR: 8, 9  Newborn Delivery   Birth date/time: 12/15/2019 22:30:00 Delivery type: VBAC, Spontaneous      Baby Feeding: Bottle Disposition:home with mother   12/17/2019 Daisy Floro, DO   I confirm that I have verified the information documented in the resident's note and that I have also personally reviewed the history, physical exam and all medical decision making activities of this service and have verified that all service and findings are accurately documented in this student's note.   (add any additional history, PE or MDM you re-performed)  Lezlie Lye, NP 12/17/2019 12:04 PM

## 2019-12-16 DIAGNOSIS — Z3046 Encounter for surveillance of implantable subdermal contraceptive: Secondary | ICD-10-CM

## 2019-12-16 DIAGNOSIS — Z30430 Encounter for insertion of intrauterine contraceptive device: Secondary | ICD-10-CM

## 2019-12-16 LAB — CBC
HCT: 30 % — ABNORMAL LOW (ref 36.0–46.0)
Hemoglobin: 9.5 g/dL — ABNORMAL LOW (ref 12.0–15.0)
MCH: 24 pg — ABNORMAL LOW (ref 26.0–34.0)
MCHC: 31.7 g/dL (ref 30.0–36.0)
MCV: 75.8 fL — ABNORMAL LOW (ref 80.0–100.0)
Platelets: 143 10*3/uL — ABNORMAL LOW (ref 150–400)
RBC: 3.96 MIL/uL (ref 3.87–5.11)
RDW: 14.7 % (ref 11.5–15.5)
WBC: 10.3 10*3/uL (ref 4.0–10.5)
nRBC: 0 % (ref 0.0–0.2)

## 2019-12-16 LAB — RPR: RPR Ser Ql: NONREACTIVE

## 2019-12-16 MED ORDER — ONDANSETRON HCL 4 MG PO TABS: 4.0000 mg | ORAL_TABLET | ORAL | Status: DC | PRN

## 2019-12-16 MED ORDER — DIPHENHYDRAMINE HCL 25 MG PO CAPS: 25.0000 mg | ORAL_CAPSULE | Freq: Four times a day (QID) | ORAL | Status: DC | PRN

## 2019-12-16 MED ORDER — ETONOGESTREL 68 MG ~~LOC~~ IMPL
68.0000 mg | DRUG_IMPLANT | Freq: Once | SUBCUTANEOUS | Status: AC
  Administered 2019-12-16: 68 mg via SUBCUTANEOUS
  Filled 2019-12-16: qty 1

## 2019-12-16 MED ORDER — IBUPROFEN 600 MG PO TABS
600.0000 mg | ORAL_TABLET | Freq: Four times a day (QID) | ORAL | Status: DC
  Administered 2019-12-16 – 2019-12-17 (×6): 600 mg via ORAL
  Filled 2019-12-16 (×5): qty 1

## 2019-12-16 MED ORDER — SENNOSIDES-DOCUSATE SODIUM 8.6-50 MG PO TABS
2.0000 | ORAL_TABLET | ORAL | Status: DC
  Administered 2019-12-16 – 2019-12-17 (×2): 2 via ORAL
  Filled 2019-12-16 (×2): qty 2

## 2019-12-16 MED ORDER — ZOLPIDEM TARTRATE 5 MG PO TABS: 5.0000 mg | ORAL_TABLET | Freq: Every evening | ORAL | Status: DC | PRN

## 2019-12-16 MED ORDER — SIMETHICONE 80 MG PO CHEW: 80.0000 mg | CHEWABLE_TABLET | ORAL | Status: DC | PRN

## 2019-12-16 MED ORDER — ONDANSETRON HCL 4 MG/2ML IJ SOLN: 4.0000 mg | INTRAMUSCULAR | Status: DC | PRN

## 2019-12-16 MED ORDER — OXYCODONE HCL 5 MG PO TABS
5.0000 mg | ORAL_TABLET | ORAL | Status: DC | PRN
  Administered 2019-12-17: 5 mg via ORAL
  Filled 2019-12-16: qty 1

## 2019-12-16 MED ORDER — COCONUT OIL OIL: 1.0000 "application " | TOPICAL_OIL | Status: DC | PRN

## 2019-12-16 MED ORDER — BENZOCAINE-MENTHOL 20-0.5 % EX AERO
1.0000 "application " | INHALATION_SPRAY | CUTANEOUS | Status: DC | PRN
  Administered 2019-12-16: 1 via TOPICAL
  Filled 2019-12-16: qty 56

## 2019-12-16 MED ORDER — PRENATAL MULTIVITAMIN CH
1.0000 | ORAL_TABLET | Freq: Every day | ORAL | Status: DC
  Administered 2019-12-16: 1 via ORAL
  Filled 2019-12-16: qty 1

## 2019-12-16 MED ORDER — TETANUS-DIPHTH-ACELL PERTUSSIS 5-2.5-18.5 LF-MCG/0.5 IM SUSP: 0.5000 mL | Freq: Once | INTRAMUSCULAR | Status: DC

## 2019-12-16 MED ORDER — OXYCODONE HCL 5 MG PO TABS
10.0000 mg | ORAL_TABLET | ORAL | Status: DC | PRN
  Administered 2019-12-16 (×4): 10 mg via ORAL
  Filled 2019-12-16 (×4): qty 2

## 2019-12-16 MED ORDER — ACETAMINOPHEN 325 MG PO TABS: 650.0000 mg | ORAL_TABLET | ORAL | Status: DC | PRN

## 2019-12-16 MED ORDER — DIBUCAINE (PERIANAL) 1 % EX OINT: 1.0000 "application " | TOPICAL_OINTMENT | CUTANEOUS | Status: DC | PRN

## 2019-12-16 MED ORDER — LIDOCAINE HCL 1 % IJ SOLN
0.0000 mL | Freq: Once | INTRAMUSCULAR | Status: AC | PRN
  Administered 2019-12-16: 20 mL via INTRADERMAL
  Filled 2019-12-16: qty 20

## 2019-12-16 MED ORDER — WITCH HAZEL-GLYCERIN EX PADS: 1.0000 "application " | MEDICATED_PAD | CUTANEOUS | Status: DC | PRN

## 2019-12-16 NOTE — Progress Notes (Signed)
CSW received consult for hx of  Depression.  CSW met with MOB to offer support and complete assessment.    CSW met with MOB at bedside. MOB was alert, pleasant, and engaging throughout visit. Newborn, Linda Keith, was present and sleeping in bassinet at time of visit. MOB identified current mood as "feeling good" and denied any current BH concerns. 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 MOB detailed this event occurred while second child, Linda Keith, was 4-5 months, and lasted for two months. MOB reported no medication and one counseling visit, after having sx at work, as treatment. MOB expressed she is not taking active prescription for Zoloft, and plans to wait and evaluate with doctor if needed in the future.   MOB identified FOB, and her mom, dad, and family as support system. MOB reported having all needed items for baby including carseat and bassinet for safe sleeping.   CSW provided education regarding the baby blues period vs. perinatal mood disorders, discussed treatment and gave resources for mental health follow up if concerns arise.  CSW recommends self-evaluation during the postpartum time period using the New Mom Checklist from Postpartum Progress and encouraged MOB to contact a medical professional if symptoms are noted at any time.   CSW provided review of Sudden Infant Death Syndrome (SIDS) precautions.   CSW identifies no further need for intervention and no barriers to discharge at this time.  Linda Keith, MSW, LCSWA Clinical Social Worker [phone] 

## 2019-12-16 NOTE — Progress Notes (Addendum)
POSTPARTUM PROGRESS NOTE  Subjective: Linda Keith is a 24 y.o. G3P2002 PPD1, VAVD at [redacted]w[redacted]d.  She reports she is doing well. No acute events overnight. She denies any problems with ambulating, Her pain is 8/10 before oxycodone dose effects. Denies nausea or vomiting.  Lochia is greater than menses.  Objective: Blood pressure 114/62, pulse 89, temperature 98.1 F (36.7 C), temperature source Oral, resp. rate 18, height 5\' 4"  (1.626 m), weight 82.6 kg, last menstrual period 02/28/2019, SpO2 97 %, unknown if currently breastfeeding.  Physical Exam:  General: alert, cooperative and no distress Chest: no respiratory distress CV: RRR, no murmurs or gallops Abdomen: soft, non-tender  Uterine Fundus: firm, appropriately tender Extremities: No calf swelling or tenderness  no edema  Recent Labs    12/15/19 2147 12/16/19 0355  HGB 11.0* 9.5*  HCT 36.1 30.0*    Assessment/Plan: Linda Keith is a 24 y.o. G3P2002 PPD1, VAVD with placental abruption at [redacted]w[redacted]d.  Routine Postpartum Care: Doing well, pain moderately controlled.  -- Continue routine care, lactation support  -- Contraception: nexplanon -- Feeding: bottle  Dispo: Plan for discharge tomorrow if she demonstrate SVS.  Labor precautions.   Yasaman Kolek A. [redacted]w[redacted]d [facility], [place]  MS 3

## 2019-12-17 ENCOUNTER — Encounter (HOSPITAL_COMMUNITY): Payer: Self-pay | Admitting: Obstetrics & Gynecology

## 2019-12-17 NOTE — Discharge Instructions (Signed)
Postpartum Care After Vaginal Delivery This sheet gives you information about how to care for yourself from the time you deliver your baby to up to 6-12 weeks after delivery (postpartum period). Your health care provider may also give you more specific instructions. If you have problems or questions, contact your health care provider. Follow these instructions at home: Vaginal bleeding  It is normal to have vaginal bleeding (lochia) after delivery. Wear a sanitary pad for vaginal bleeding and discharge. ? During the first week after delivery, the amount and appearance of lochia is often similar to a menstrual period. ? Over the next few weeks, it will gradually decrease to a dry, yellow-brown discharge. ? For most women, lochia stops completely by 4-6 weeks after delivery. Vaginal bleeding can vary from woman to woman.  Change your sanitary pads frequently. Watch for any changes in your flow, such as: ? A sudden increase in volume. ? A change in color. ? Large blood clots.  If you pass a blood clot from your vagina, save it and call your health care provider to discuss. Do not flush blood clots down the toilet before talking with your health care provider.  Do not use tampons or douches until your health care provider says this is safe.  If you are not breastfeeding, your period should return 6-8 weeks after delivery. If you are feeding your child breast milk only (exclusive breastfeeding), your period may not return until you stop breastfeeding. Perineal care  Keep the area between the vagina and the anus (perineum) clean and dry as told by your health care provider. Use medicated pads and pain-relieving sprays and creams as directed.  If you had a cut in the perineum (episiotomy) or a tear in the vagina, check the area for signs of infection until you are healed. Check for: ? More redness, swelling, or pain. ? Fluid or blood coming from the cut or tear. ? Warmth. ? Pus or a bad  smell.  You may be given a squirt bottle to use instead of wiping to clean the perineum area after you go to the bathroom. As you start healing, you may use the squirt bottle before wiping yourself. Make sure to wipe gently.  To relieve pain caused by an episiotomy, a tear in the vagina, or swollen veins in the anus (hemorrhoids), try taking a warm sitz bath 2-3 times a day. A sitz bath is a warm water bath that is taken while you are sitting down. The water should only come up to your hips and should cover your buttocks. Breast care  Within the first few days after delivery, your breasts may feel heavy, full, and uncomfortable (breast engorgement). Milk may also leak from your breasts. Your health care provider can suggest ways to help relieve the discomfort. Breast engorgement should go away within a few days.  If you are breastfeeding: ? Wear a bra that supports your breasts and fits you well. ? Keep your nipples clean and dry. Apply creams and ointments as told by your health care provider. ? You may need to use breast pads to absorb milk that leaks from your breasts. ? You may have uterine contractions every time you breastfeed for up to several weeks after delivery. Uterine contractions help your uterus return to its normal size. ? If you have any problems with breastfeeding, work with your health care provider or lactation consultant.  If you are not breastfeeding: ? Avoid touching your breasts a lot. Doing this can make   your breasts produce more milk. ? Wear a good-fitting bra and use cold packs to help with swelling. ? Do not squeeze out (express) milk. This causes you to make more milk. Intimacy and sexuality  Ask your health care provider when you can engage in sexual activity. This may depend on: ? Your risk of infection. ? How fast you are healing. ? Your comfort and desire to engage in sexual activity.  You are able to get pregnant after delivery, even if you have not had  your period. If desired, talk with your health care provider about methods of birth control (contraception). Medicines  Take over-the-counter and prescription medicines only as told by your health care provider.  If you were prescribed an antibiotic medicine, take it as told by your health care provider. Do not stop taking the antibiotic even if you start to feel better. Activity  Gradually return to your normal activities as told by your health care provider. Ask your health care provider what activities are safe for you.  Rest as much as possible. Try to rest or take a nap while your baby is sleeping. Eating and drinking   Drink enough fluid to keep your urine pale yellow.  Eat high-fiber foods every day. These may help prevent or relieve constipation. High-fiber foods include: ? Whole grain cereals and breads. ? Brown rice. ? Beans. ? Fresh fruits and vegetables.  Do not try to lose weight quickly by cutting back on calories.  Take your prenatal vitamins until your postpartum checkup or until your health care provider tells you it is okay to stop. Lifestyle  Do not use any products that contain nicotine or tobacco, such as cigarettes and e-cigarettes. If you need help quitting, ask your health care provider.  Do not drink alcohol, especially if you are breastfeeding. General instructions  Keep all follow-up visits for you and your baby as told by your health care provider. Most women visit their health care provider for a postpartum checkup within the first 3-6 weeks after delivery. Contact a health care provider if:  You feel unable to cope with the changes that your child brings to your life, and these feelings do not go away.  You feel unusually sad or worried.  Your breasts become red, painful, or hard.  You have a fever.  You have trouble holding urine or keeping urine from leaking.  You have little or no interest in activities you used to enjoy.  You have not  breastfed at all and you have not had a menstrual period for 12 weeks after delivery.  You have stopped breastfeeding and you have not had a menstrual period for 12 weeks after you stopped breastfeeding.  You have questions about caring for yourself or your baby.  You pass a blood clot from your vagina. Get help right away if:  You have chest pain.  You have difficulty breathing.  You have sudden, severe leg pain.  You have severe pain or cramping in your lower abdomen.  You bleed from your vagina so much that you fill more than one sanitary pad in one hour. Bleeding should not be heavier than your heaviest period.  You develop a severe headache.  You faint.  You have blurred vision or spots in your vision.  You have bad-smelling vaginal discharge.  You have thoughts about hurting yourself or your baby. If you ever feel like you may hurt yourself or others, or have thoughts about taking your own life, get help  right away. You can go to the nearest emergency department or call:  Your local emergency services (911 in the [place]  A suicide crisis helpline, such as the National Suicide Prevention Lifeline at 830-323-9213. This is open 24 hours a day. Summary  The period of time right after you deliver your newborn up to 6-12 weeks after delivery is called the postpartum period.  Gradually return to your normal activities as told by your health care provider.  Keep all follow-up visits for you and your baby as told by your health care provider.

## 2019-12-18 ENCOUNTER — Encounter (HOSPITAL_COMMUNITY): Payer: Self-pay | Admitting: Obstetrics & Gynecology

## 2019-12-18 LAB — SURGICAL PATHOLOGY

## 2020-01-02 ENCOUNTER — Telehealth: Payer: Self-pay | Admitting: Family Medicine

## 2020-01-02 NOTE — Telephone Encounter (Signed)
Patient says she is returning Dr. Starr Sinclair call. Please call patient back when you get a chance.

## 2020-01-11 NOTE — Progress Notes (Deleted)
    SUBJECTIVE:   CHIEF COMPLAINT / HPI:   Postpartum Delivery 2/19  PERTINENT  PMH / PSH: ***  OBJECTIVE:   There were no vitals taken for this visit.  ***  ASSESSMENT/PLAN:   No problem-specific Assessment & Plan notes found for this encounter.     Linda Mo, MD [facility]

## 2020-01-12 ENCOUNTER — Ambulatory Visit: Payer: Medicaid Other | Admitting: Family Medicine

## 2020-01-30 ENCOUNTER — Ambulatory Visit: Payer: Medicaid Other | Admitting: Family Medicine

## 2020-03-13 ENCOUNTER — Other Ambulatory Visit: Payer: Self-pay

## 2020-03-13 ENCOUNTER — Encounter: Payer: Self-pay | Admitting: Family Medicine

## 2020-03-13 ENCOUNTER — Ambulatory Visit (INDEPENDENT_AMBULATORY_CARE_PROVIDER_SITE_OTHER): Payer: Medicaid Other | Admitting: Family Medicine

## 2020-03-13 VITALS — BP 105/75 | HR 75 | Ht 63.0 in | Wt 154.2 lb

## 2020-03-13 DIAGNOSIS — Z0000 Encounter for general adult medical examination without abnormal findings: Secondary | ICD-10-CM | POA: Insufficient documentation

## 2020-03-13 NOTE — Patient Instructions (Signed)
Health Maintenance, Female Adopting a healthy lifestyle and getting preventive care are important in promoting health and wellness. Ask your health care provider about:  The right schedule for you to have regular tests and exams.  Things you can do on your own to prevent diseases and keep yourself healthy. What should I know about diet, weight, and exercise? Eat a healthy diet   Eat a diet that includes plenty of vegetables, fruits, low-fat dairy products, and lean protein.  Do not eat a lot of foods that are high in solid fats, added sugars, or sodium. Maintain a healthy weight Body mass index (BMI) is used to identify weight problems. It estimates body fat based on height and weight. Your health care provider can help determine your BMI and help you achieve or maintain a healthy weight. Get regular exercise Get regular exercise. This is one of the most important things you can do for your health. Most adults should:  Exercise for at least 150 minutes each week. The exercise should increase your heart rate and make you sweat (moderate-intensity exercise).  Do strengthening exercises at least twice a week. This is in addition to the moderate-intensity exercise.  Spend less time sitting. Even light physical activity can be beneficial. Watch cholesterol and blood lipids Have your blood tested for lipids and cholesterol at 23 years of age, then have this test every 5 years. Have your cholesterol levels checked more often if:  Your lipid or cholesterol levels are high.  You are older than 23 years of age.  You are at high risk for heart disease. What should I know about cancer screening? Depending on your health history and family history, you may need to have cancer screening at various ages. This may include screening for:  Breast cancer.  Cervical cancer.  Colorectal cancer.  Skin cancer.  Lung cancer. What should I know about heart disease, diabetes, and high blood  pressure? Blood pressure and heart disease  High blood pressure causes heart disease and increases the risk of stroke. This is more likely to develop in people who have high blood pressure readings, are of African descent, or are overweight.  Have your blood pressure checked: ? Every 3-5 years if you are 18-39 years of age. ? Every year if you are 40 years old or older. Diabetes Have regular diabetes screenings. This checks your fasting blood sugar level. Have the screening done:  Once every three years after age 40 if you are at a normal weight and have a low risk for diabetes.  More often and at a younger age if you are overweight or have a high risk for diabetes. What should I know about preventing infection? Hepatitis B If you have a higher risk for hepatitis B, you should be screened for this virus. Talk with your health care provider to find out if you are at risk for hepatitis B infection. Hepatitis C Testing is recommended for:  Everyone born from 1945 through 1965.  Anyone with known risk factors for hepatitis C. Sexually transmitted infections (STIs)  Get screened for STIs, including gonorrhea and chlamydia, if: ? You are sexually active and are younger than 24 years of age. ? You are older than 24 years of age and your health care provider tells you that you are at risk for this type of infection. ? Your sexual activity has changed since you were last screened, and you are at increased risk for chlamydia or gonorrhea. Ask your health care provider if   you are at risk.  Ask your health care provider about whether you are at high risk for HIV. Your health care provider may recommend a prescription medicine to help prevent HIV infection. If you choose to take medicine to prevent HIV, you should first get tested for HIV. You should then be tested every 3 months for as long as you are taking the medicine. Pregnancy  If you are about to stop having your period (premenopausal) and  you may become pregnant, seek counseling before you get pregnant.  Take 400 to 800 micrograms (mcg) of folic acid every day if you become pregnant.  Ask for birth control (contraception) if you want to prevent pregnancy. Osteoporosis and menopause Osteoporosis is a disease in which the bones lose minerals and strength with aging. This can result in bone fractures. If you are 65 years old or older, or if you are at risk for osteoporosis and fractures, ask your health care provider if you should:  Be screened for bone loss.  Take a calcium or vitamin D supplement to lower your risk of fractures.  Be given hormone replacement therapy (HRT) to treat symptoms of menopause. Follow these instructions at home: Lifestyle  Do not use any products that contain nicotine or tobacco, such as cigarettes, e-cigarettes, and chewing tobacco. If you need help quitting, ask your health care provider.  Do not use street drugs.  Do not share needles.  Ask your health care provider for help if you need support or information about quitting drugs. Alcohol use  Do not drink alcohol if: ? Your health care provider tells you not to drink. ? You are pregnant, may be pregnant, or are planning to become pregnant.  If you drink alcohol: ? Limit how much you use to 0-1 drink a day. ? Limit intake if you are breastfeeding.  Be aware of how much alcohol is in your drink. In the [place] one drink equals one 12 oz bottle of beer (355 mL), one 5 oz glass of wine (148 mL), or one 1 oz glass of hard liquor (44 mL). General instructions  Schedule regular health, dental, and eye exams.  Stay current with your vaccines.  Tell your health care provider if: ? You often feel depressed. ? You have ever been abused or do not feel safe at home. Summary  Adopting a healthy lifestyle and getting preventive care are important in promoting health and wellness.  Follow your health care provider's instructions about healthy  diet, exercising, and getting tested or screened for diseases.  Follow your health care provider's instructions on monitoring your cholesterol and blood pressure. This information is not intended to replace advice given to you by your health care provider. Make sure you discuss any questions you have with your health care provider. Document Revised: 10/05/2018 Document Reviewed: 10/05/2018 Elsevier Patient Education  2020 [employer]vier Inc[employer]  

## 2020-03-13 NOTE — Progress Notes (Signed)
Patient ID: Linda Keith, female   DOB: May 11, 1996, 24 y.o.   MRN: [id number]  24 y.o. year old female presents for well woman/preventative visit   Acute Concerns: None   Exercise: Not currently active, plans to start exercise regimen today  Sexual/Birth History: Currently sexually active Did not endorse using barriar protection Not on a prenatal vitamin. Discussed low risk of NTD while on [facility].  [id number]  Birth Control: Nexplanon Surgical History: Past Surgical History:  Procedure Laterality Date  . CESAREAN SECTION N/A 11/14/2014   Procedure: CESAREAN SECTION;  Surgeon: Catalina Antigua, MD;  Location: [facility];  Service: Obstetrics;  Laterality: N/A;   Allergies: No Known Allergies  Social:  Social History   Socioeconomic History  . Marital status: Single    Spouse name: Not on file  . Number of children: 3  . Years of education: Not on file  . Highest education level: Not on file  Occupational History  . Not on file  Tobacco Use  . Smoking status: Never Smoker  . Smokeless tobacco: Never Used  Substance and Sexual Activity  . Alcohol use: Yes    Alcohol/week: 2.0 standard drinks    Types: 2 Glasses of wine per week  . Drug use: No  . Sexual activity: Yes    Birth control/protection: Implant  Other Topics Concern  . Not on file  Social History Narrative  . Not on file   Social Determinants of Health   Financial Resource Strain:   . Difficulty of Paying Living Expenses:   Food Insecurity:   . Worried About Programme researcher, broadcasting/film/video in the Last Year:   . Barista in the Last Year:   Transportation Needs:   . Freight forwarder (Medical):   Marland Kitchen Lack of Transportation (Non-Medical):   Physical Activity: Inactive  . Days of Exercise per Week: 0 days  . Minutes of Exercise per Session: 0 min  Stress:   . Feeling of Stress :   Social Connections:   . Frequency of Communication with Friends and Family:   . Frequency of Social Gatherings with Friends  and Family:   . Attends Religious Services:   . Active Member of Clubs or Organizations:   . Attends Banker Meetings:   Marland Kitchen Marital Status:     Immunization: Immunization History  Administered Date(s) Administered  . Influenza,inj,Quad PF,6+ Mos 10/24/2014, 08/30/2015, 08/02/2018, 11/30/2019  . Influenza-Unspecified 07/26/2017  . PPD Test 11/29/2018  . Tdap 10/04/2014, 12/02/2018, 11/16/2019    Health Maintenance  Topic Date Due  . COVID-19 Vaccine (1) Never done  . INFLUENZA VACCINE  05/26/2020  . CHLAMYDIA SCREENING  11/15/2020  . PAP-Cervical Cytology Screening  10/07/2021  . PAP SMEAR-Modifier  10/07/2021  . TETANUS/TDAP  11/15/2029  . HIV Screening  Completed  Discussed need for covid vaccine. Patient is ambivalent at this time regarding the vaccine.   Physical Exam: VITALS: Reviewed GEN: Pleasant female, NAD HEENT: Normocephalic, PERRL, EOMI, no scleral icterus, bilateral TM pearly grey, nasal septum midline, MMM, uvula midline, no anterior or posterior lymphadenopathy, no thyromegaly CARDIAC:RRR, S1 and S2 present, no murmur, no heaves/thrills RESP: CTAB, normal effort ABD: Soft, no tenderness, normal bowel sounds EXT: No edema, 2+ radial and DP pulses SKIN: Warm and dry, no rash  ASSESSMENT & PLAN: 24 y.o. female presents for annual well woman/preventative exam.  Doing well. F/U in 1 year  Coucelled on exercise, PNV, vaccines, alcohol use. Screened neg for depression, IPV. UTD on  Pap smear, GC screen.   Josephine Igo, MD, MS PGY-3 [facility]

## 2020-03-28 IMAGING — US US OB COMP LESS 14 WK
1 series · 15 of 26 positions shown · non-contrast
Comparison: None.

CLINICAL DATA: Eight week gestation

EXAM:
OBSTETRIC <14 WK ULTRASOUND
TECHNIQUE: Transabdominal ultrasound was performed for evaluation of the
gestation as well as the maternal uterus and adnexal regions.

[Series 1: us ob comp less 14 wk · 26 acquisitions, 15 frames shown]
[im 1/26]
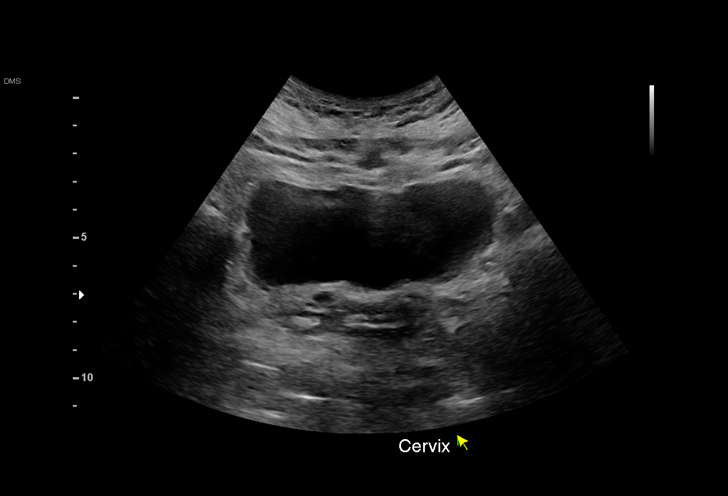
[im 3/26]
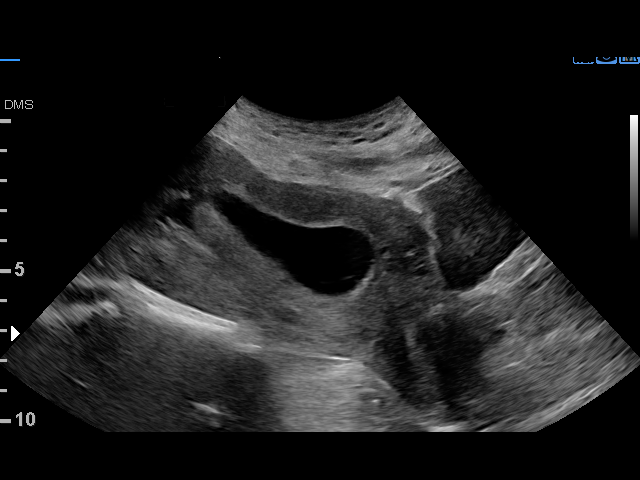
[im 5/26]
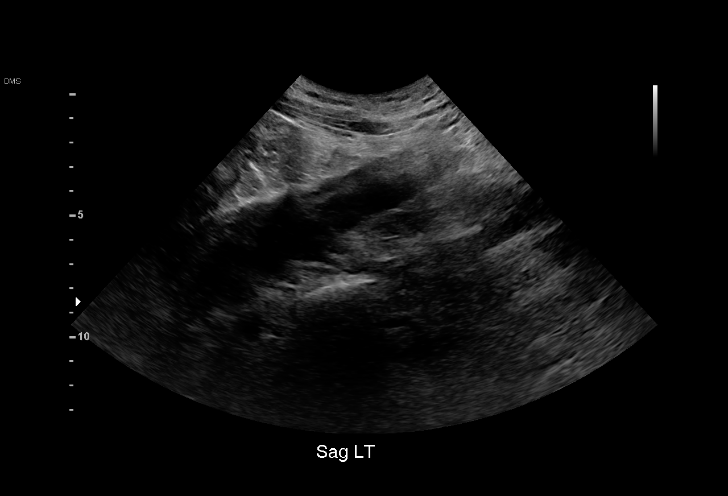
[im 7/26]
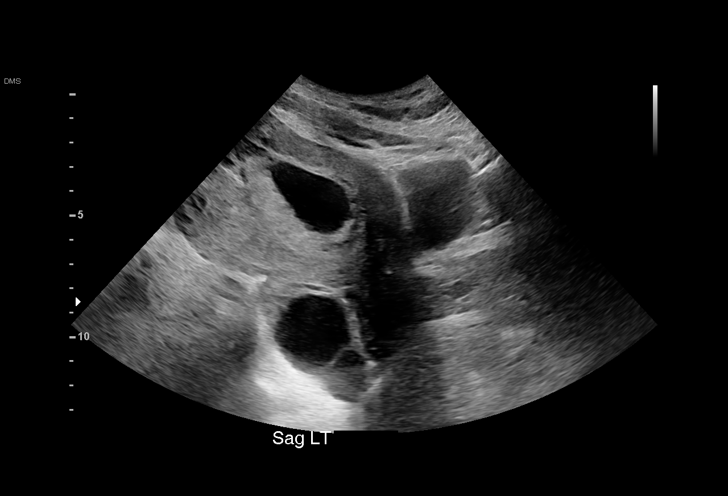
[im 8/26]
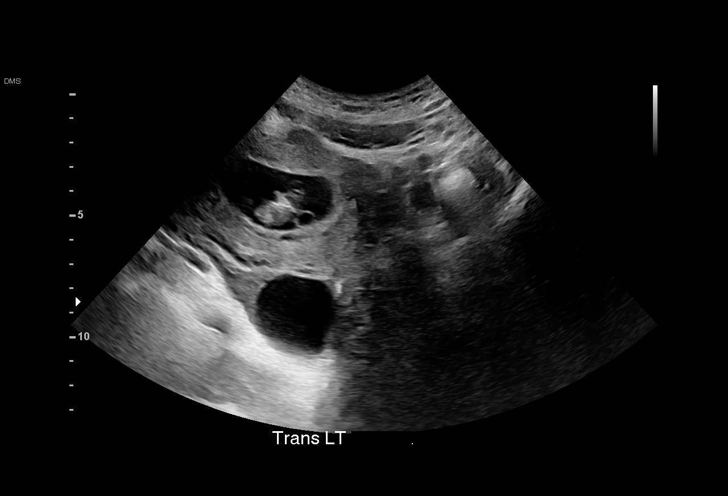
[im 10/26]
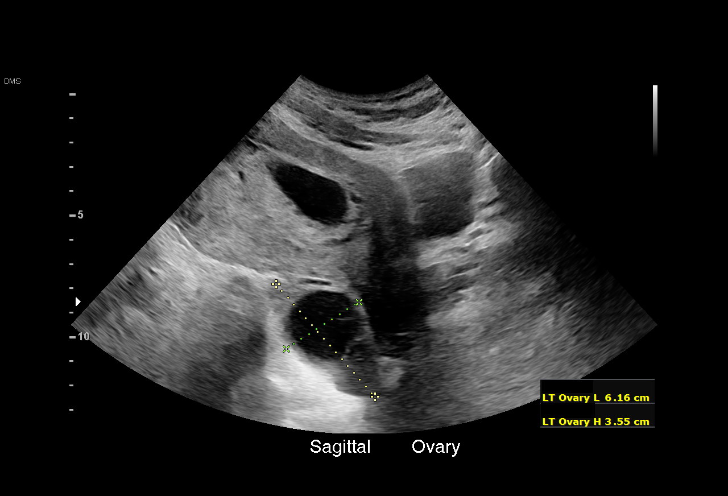
[im 12/26]
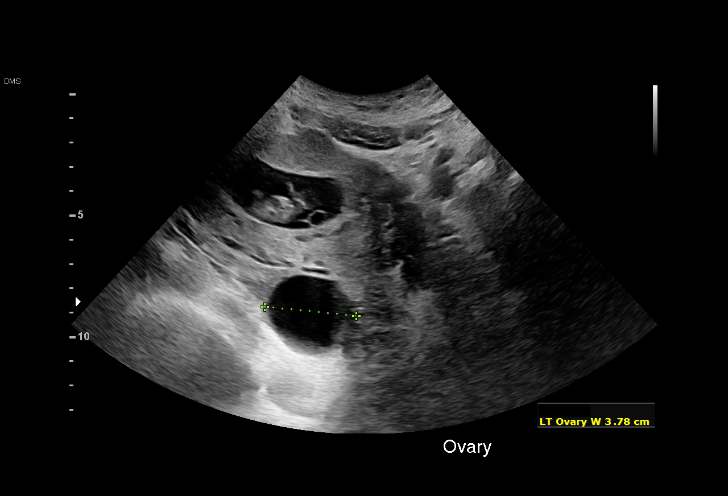
[im 14/26]
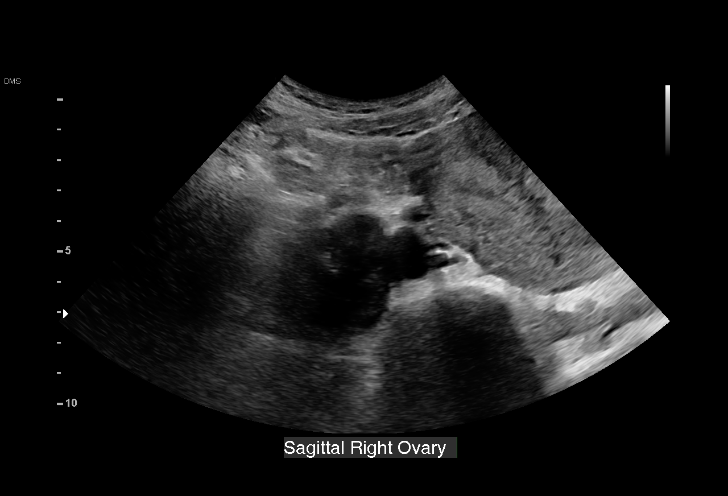
[im 15/26]
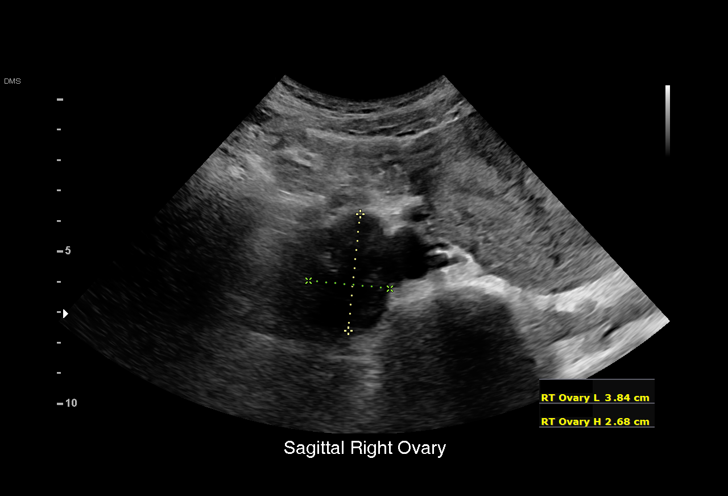
[im 17/26]
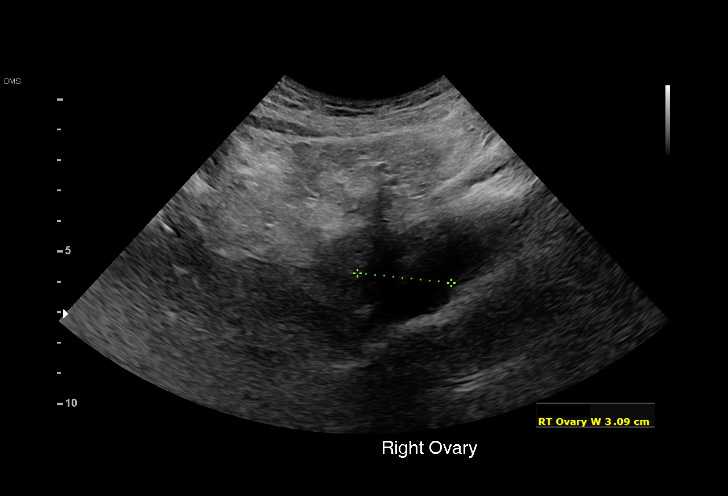
[im 19/26]
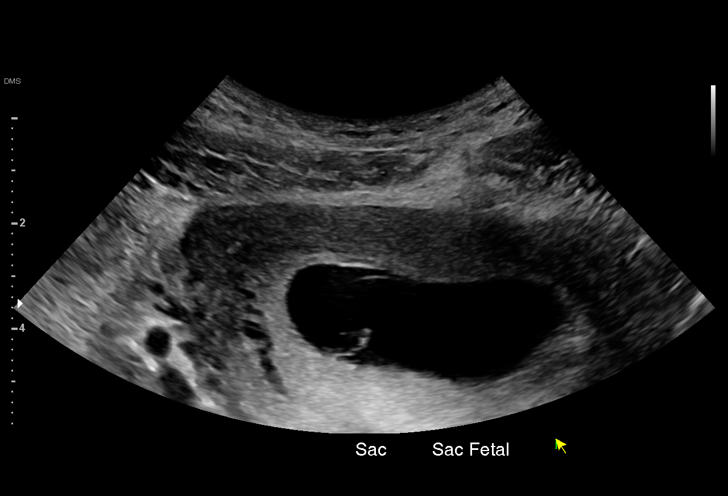
[im 20/26]
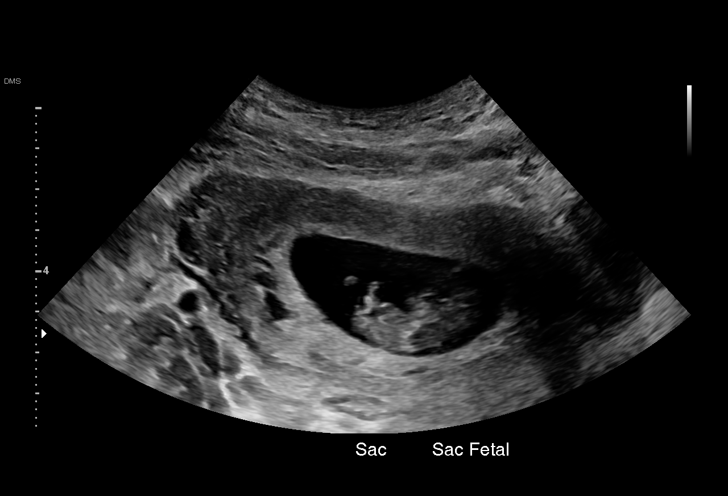
[im 22/26]
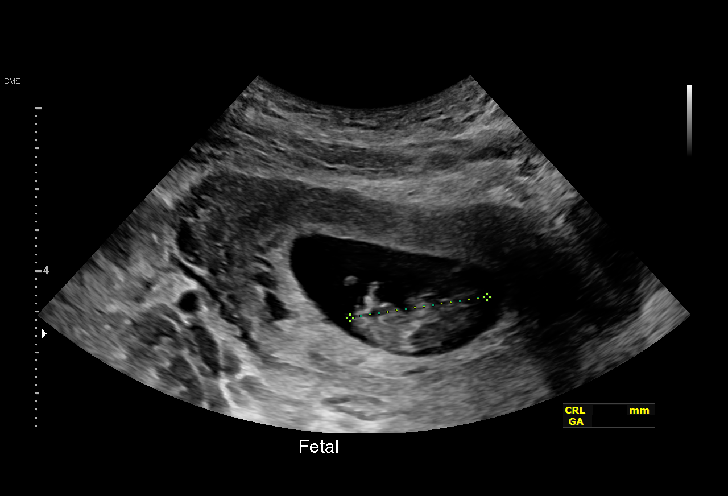
[im 24/26]
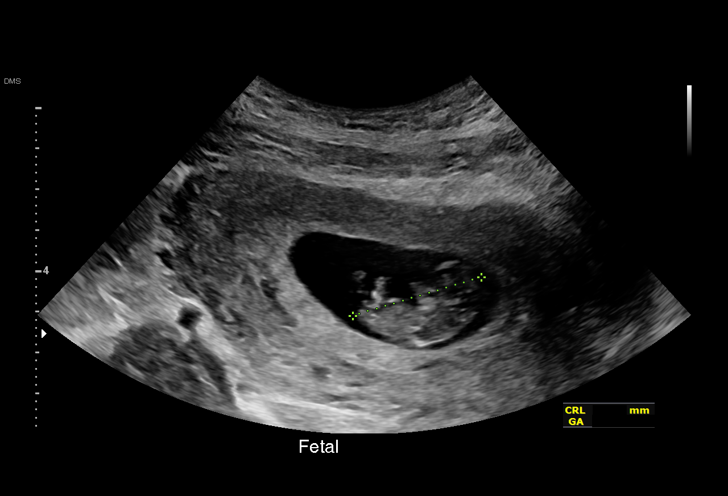
[im 26/26]
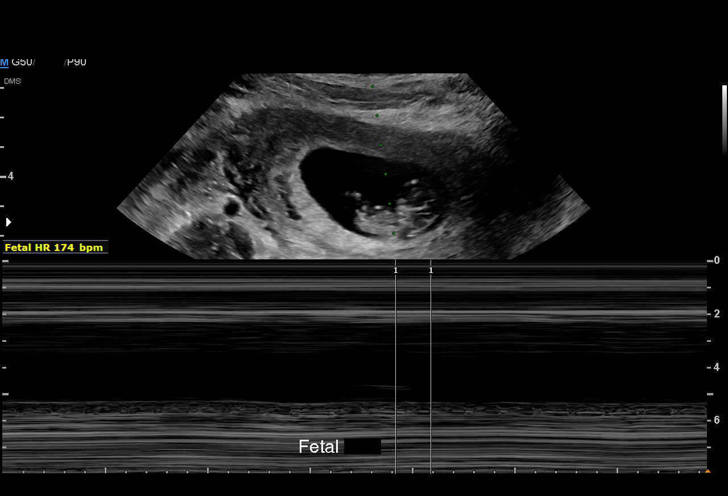

[15 of 26 positions shown; findings below may reference images not displayed]

FINDINGS: Intrauterine gestational sac: Single

Yolk sac:  Visualized

Embryo:  Visualized

Cardiac Activity: Visualized

Heart Rate: 173 bpm

MSD:   mm    w     d

CRL:   33.2 mm   10 w 1 d                  US EDC: 02/06/2019

Subchorionic hemorrhage:  None visualized.

Maternal uterus/adnexae: No adnexal mass or free fluid.
IMPRESSION: Ten week 1 day intrauterine pregnancy. Fetal heart rate 173 beats
per minute. No acute maternal findings.

## 2020-05-08 ENCOUNTER — Other Ambulatory Visit: Payer: Self-pay

## 2020-05-08 ENCOUNTER — Encounter: Payer: Self-pay | Admitting: Family Medicine

## 2020-05-08 ENCOUNTER — Ambulatory Visit (INDEPENDENT_AMBULATORY_CARE_PROVIDER_SITE_OTHER): Payer: Medicaid Other | Admitting: Family Medicine

## 2020-05-08 VITALS — BP 100/60 | HR 97 | Ht 63.0 in | Wt 153.0 lb

## 2020-05-08 DIAGNOSIS — M792 Neuralgia and neuritis, unspecified: Secondary | ICD-10-CM

## 2020-05-08 DIAGNOSIS — N926 Irregular menstruation, unspecified: Secondary | ICD-10-CM

## 2020-05-08 DIAGNOSIS — R61 Generalized hyperhidrosis: Secondary | ICD-10-CM

## 2020-05-08 HISTORY — DX: Neuralgia and neuritis, unspecified: M79.2

## 2020-05-08 MED ORDER — NORGESTIM-ETH ESTRAD TRIPHASIC 0.18/0.215/0.25 MG-25 MCG PO TABS: 1.0000 | ORAL_TABLET | Freq: Every day | ORAL | 0 refills | Status: DC

## 2020-05-08 NOTE — Progress Notes (Signed)
SUBJECTIVE:   CHIEF COMPLAINT / HPI:   Adverse effects after Nexplanon Patient is a 24 year old female the presents today to discuss options for contraception as she has had some adverse effects with her Nexplanon.  Patient previously gave birth in February of this year.  Per chart review patient has Nexplanon placed in February 2021. Patient denies a history of migraines with aura, clotting disorder/history of DVT, malignancy.  She is a nonsmoker, under age 59.  Patient states that she previously had a Nexplanon placed years ago and when this occurred she got a nerve pain in that hand.  Because of that her Nexplanon at that time was moved higher up which resolved this nerve pain.  Patient states that her Nexplanon was placed in February and she got the same nerve pain occurring a few days after it was placed.  She states his neck pain is worse when pressing directly on the Nexplanon and is the same as the pain she experienced with her last Nexplanon.  She states that she has had some increased bleeding while on Nexplanon where she would previously have 1.  About every month with 3 days of heavy bleeding.  She states she currently on Nexplanon has 7 days of heavy bleeding about every 2 weeks and has done so since February.  Patient states she does want to continue on contraception but is not sure what would be the best option.  She states if her bleeding can be improved she would be fine with having the Nexplanon placed in a better position and continuing with it.  She does endorse that she is also had some minor night sweats the past few weeks occurring 2 or 3 times per night.  She has not endorsed any unintentional weight loss or other concerning symptoms and states that her night sweats barely get her T-shirt wet.  Patient states she is not currently breast-feeding.  PERTINENT  PMH / PSH: VBAC in February 2021  OBJECTIVE:   BP 100/60    Pulse 97    Ht 5\' 3"  (1.6 m)    Wt 153 lb (69.4 kg)    SpO2  98%    BMI 27.10 kg/m    General: NAD, pleasant, able to participate in exam Respiratory: No respiratory distress Abdomen: Bowel sounds present, nontender, nondistended, no hepatosplenomegaly. MSK: Nexplanon palpated in right upper arm with nerve pain reproduced in the right hand with direct palpation and pressure on the Nexplanon. Psych: Normal affect and mood  ASSESSMENT/PLAN:   Irregular uterine bleeding Assessment: Irregular and heavy uterine bleeding which started after Nexplanon.  Previously had 3 days of bleeding every 1 month but since getting Nexplanon has had heavy bleeding for 6-7 days approximately every 2 weeks.  Patient is not breast-feeding and has no contraindications to estrogen therapy at this time. Plan: -Discussed initiating a 1 month trial of oral contraceptives on top of the Nexplanon to see if this improves the patient's uterine bleeding -We will check CBC today to look for any signs of anemia with her increased heavy bleeding recently -Recommend follow-up in the next 2 to 4 weeks  Night sweats Assessment: Night sweats 2 or 3 times a week for the past few weeks which patient thinks started after getting Nexplanon.  No weight loss or other concerning symptoms at this time.  Patient's night sweats do not soak through the sheets and only get her to short slightly moist per patient.  Most likely due to temperature of the room  though could be related to thyroid etiology.  Less likely due to concerning symptoms such as malignancy with no weight loss and the minimal nature of the night sweats. Plan: -We will check TSH today -CBC as above -Recommend patient to decrease the temperature of her room by 2-3 degrees and use a fan for the next 1-2 weeks to see if this improves her symptoms -Follow-up in 2 weeks if symptoms not improved  Nerve pain Assessment: Nerve pain of the right hand with palpation of the patient's Nexplanon which is in the right upper arm.  Patient states  that in the past she had a Nexplanon which had to be moved to a different position due to nerve pain with palpation of the Nexplanon.  Patient's pain only occurs with pressure on the Nexplanon and resolves when the pressure is removed.  Most likely due to Nexplanon proximity to the nerve. Plan: -Discussed with patient scheduling a follow-up appointment to have the Nexplanon removed and reinserted in a better position away from the nerve -Patient plans to schedule follow-up in the next 2 to 4 weeks for this to occur     Jackelyn Poling, DO [facility]

## 2020-05-08 NOTE — Assessment & Plan Note (Signed)
Assessment: Night sweats 2 or 3 times a week for the past few weeks which patient thinks started after getting Nexplanon.  No weight loss or other concerning symptoms at this time.  Patient's night sweats do not soak through the sheets and only get her to short slightly moist per patient.  Most likely due to temperature of the room though could be related to thyroid etiology.  Less likely due to concerning symptoms such as malignancy with no weight loss and the minimal nature of the night sweats. Plan: -We will check TSH today -CBC as above -Recommend patient to decrease the temperature of her room by 2-3 degrees and use a fan for the next 1-2 weeks to see if this improves her symptoms -Follow-up in 2 weeks if symptoms not improved

## 2020-05-08 NOTE — Assessment & Plan Note (Signed)
Assessment: Irregular and heavy uterine bleeding which started after Nexplanon.  Previously had 3 days of bleeding every 1 month but since getting Nexplanon has had heavy bleeding for 6-7 days approximately every 2 weeks.  Patient is not breast-feeding and has no contraindications to estrogen therapy at this time. Plan: -Discussed initiating a 1 month trial of oral contraceptives on top of the Nexplanon to see if this improves the patient's uterine bleeding -We will check CBC today to look for any signs of anemia with her increased heavy bleeding recently -Recommend follow-up in the next 2 to 4 weeks

## 2020-05-08 NOTE — Assessment & Plan Note (Signed)
Assessment: Nerve pain of the right hand with palpation of the patient's Nexplanon which is in the right upper arm.  Patient states that in the past she had a Nexplanon which had to be moved to a different position due to nerve pain with palpation of the Nexplanon.  Patient's pain only occurs with pressure on the Nexplanon and resolves when the pressure is removed.  Most likely due to Nexplanon proximity to the nerve. Plan: -Discussed with patient scheduling a follow-up appointment to have the Nexplanon removed and reinserted in a better position away from the nerve -Patient plans to schedule follow-up in the next 2 to 4 weeks for this to occur

## 2020-05-08 NOTE — Patient Instructions (Addendum)
It was great to see you!  Our plans for today:  -Today we discussed other options for birth control including continuing the Nexplanon, IUD, birth control pills, injection.  -I am sending in 1 month of birth control pills to help regulate your periods while on the Nexplanon. -I would like for you to schedule a follow-up visit to have her Nexplanon removed and replaced in a better position so it does not cause her nerve pain -For your night sweats I would like to check your thyroid level and CBC today, I will let you know the results of this test.  I would like for you to decrease your room temperature by 2 degrees for the next 1-2 weeks and see if this improves your night sweats.  Take care and seek immediate care sooner if you develop any concerns.   Dr. [facility]

## 2020-05-09 LAB — CBC WITH DIFFERENTIAL/PLATELET
Basophils Absolute: 0 10*3/uL (ref 0.0–0.2)
Basos: 0 %
EOS (ABSOLUTE): 0.1 10*3/uL (ref 0.0–0.4)
Eos: 2 %
Hematocrit: 38 % (ref 34.0–46.6)
Hemoglobin: 11.5 g/dL (ref 11.1–15.9)
Immature Grans (Abs): 0 10*3/uL (ref 0.0–0.1)
Immature Granulocytes: 0 %
Lymphocytes Absolute: 0.9 10*3/uL (ref 0.7–3.1)
Lymphs: 17 %
MCH: 22.5 pg — ABNORMAL LOW (ref 26.6–33.0)
MCHC: 30.3 g/dL — ABNORMAL LOW (ref 31.5–35.7)
MCV: 74 fL — ABNORMAL LOW (ref 79–97)
Monocytes Absolute: 0.4 10*3/uL (ref 0.1–0.9)
Monocytes: 8 %
Neutrophils Absolute: 3.8 10*3/uL (ref 1.4–7.0)
Neutrophils: 73 %
Platelets: 195 10*3/uL (ref 150–450)
RBC: 5.11 x10E6/uL (ref 3.77–5.28)
RDW: 14.4 % (ref 11.7–15.4)
WBC: 5.3 10*3/uL (ref 3.4–10.8)

## 2020-05-09 LAB — TSH: TSH: 1.3 u[IU]/mL (ref 0.450–4.500)

## 2020-05-20 ENCOUNTER — Ambulatory Visit: Payer: Medicaid Other | Admitting: Family Medicine

## 2020-05-20 NOTE — Progress Notes (Deleted)
    SUBJECTIVE:   CHIEF COMPLAINT / HPI:   Nexplanon removal/reinsertion Patient is a 24 year old female that presents today for Nexplanon removal and reinsertion.  We previously discussed her history of having a Nexplanon placed in a position that aggravated the nerves in her arms causing some shooting pain down to her hands.  She states that in the past she had her Nexplanon removed and placed in a different position and this resolved that issue.  She states that her most recent Nexplanon was placed in a cephalad position to the first 1 and her issue represented.***  PERTINENT  PMH / PSH: ***  OBJECTIVE:   There were no vitals taken for this visit.   General: NAD, pleasant, able to participate in exam Cardiac: RRR, no murmurs. Respiratory: CTAB, normal effort, No wheezes, rales or rhonchi Abdomen: Bowel sounds present, nontender, nondistended, no hepatosplenomegaly. Extremities: no edema or cyanosis. Skin: warm and dry, no rashes noted Neuro: alert, no obvious focal deficits Psych: Normal affect and mood  ASSESSMENT/PLAN:   No problem-specific Assessment & Plan notes found for this encounter.     Jackelyn Poling, DO [facility]

## 2020-06-01 IMAGING — US US MFM OB COMP +14 WKS
1 series · 13 of 28 positions shown · non-contrast
Comparison: none

[Series 1: us mfm ob comp +14 wks · 13 of 67 slices shown]
[im 3/67]
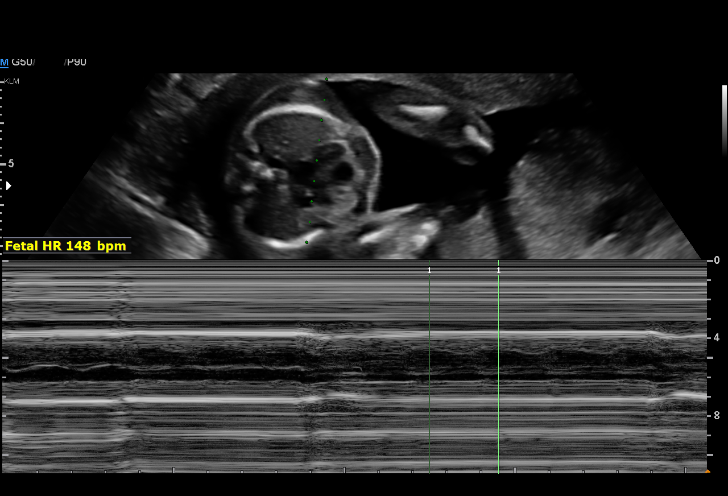
[im 8/67]
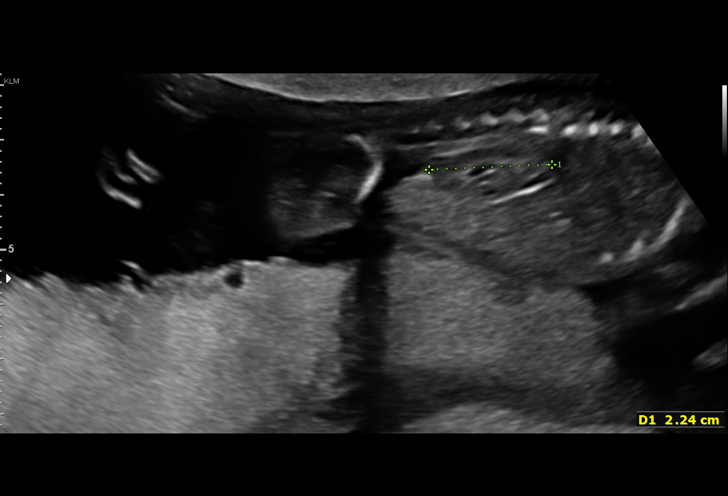
[im 13/67]
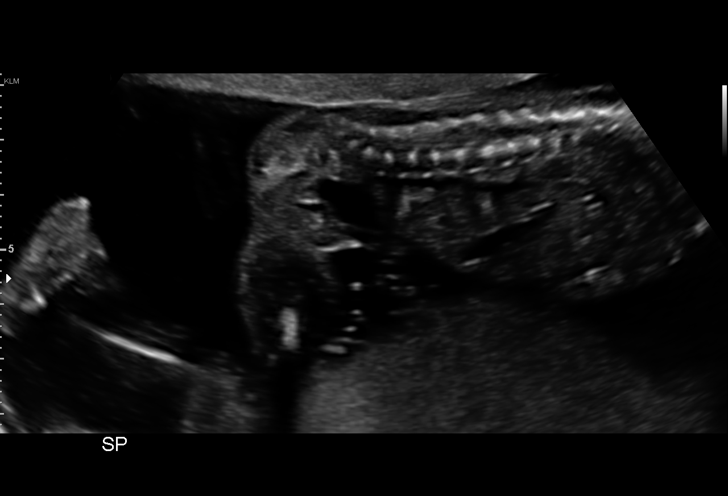
[im 18/67]
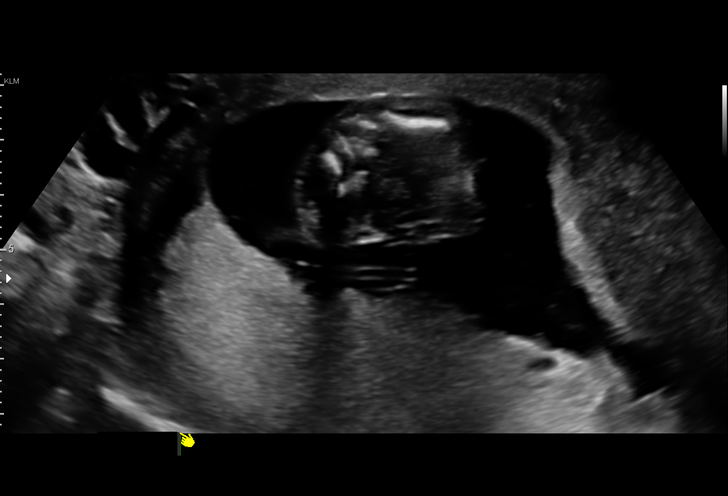
[im 23/67]
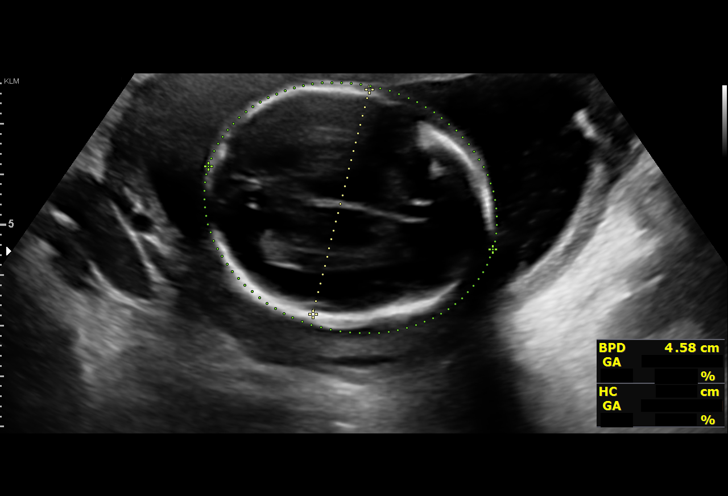
[im 27/67]
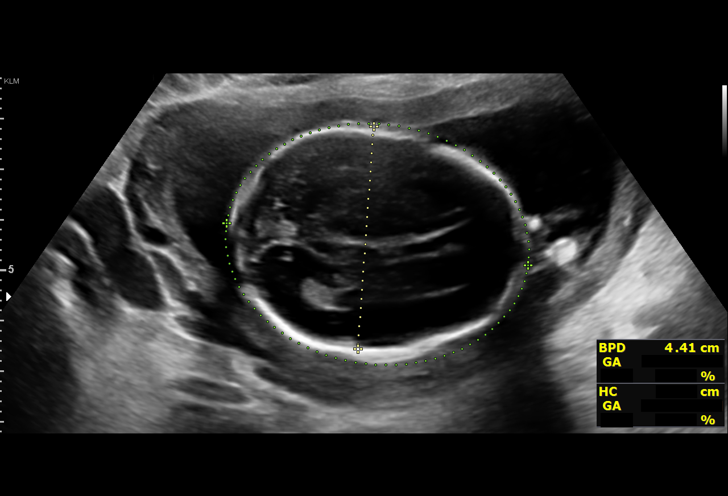
[im 35/67]
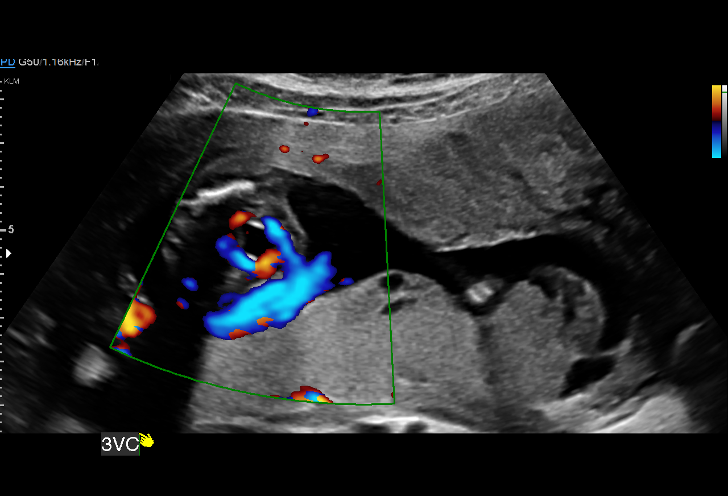
[im 40/67]
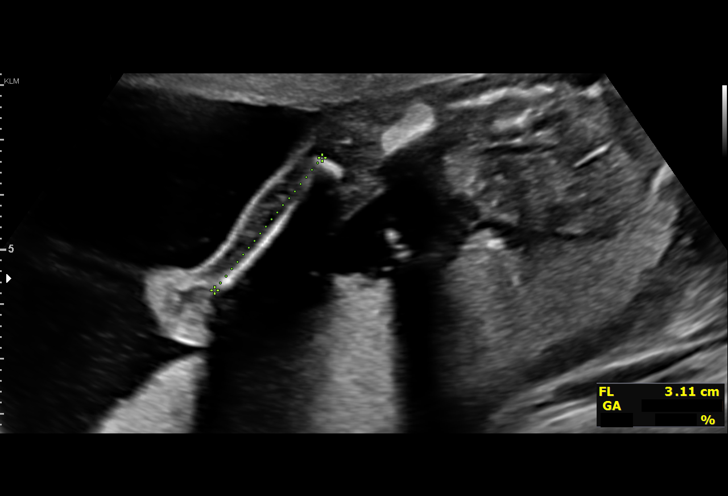
[im 45/67]
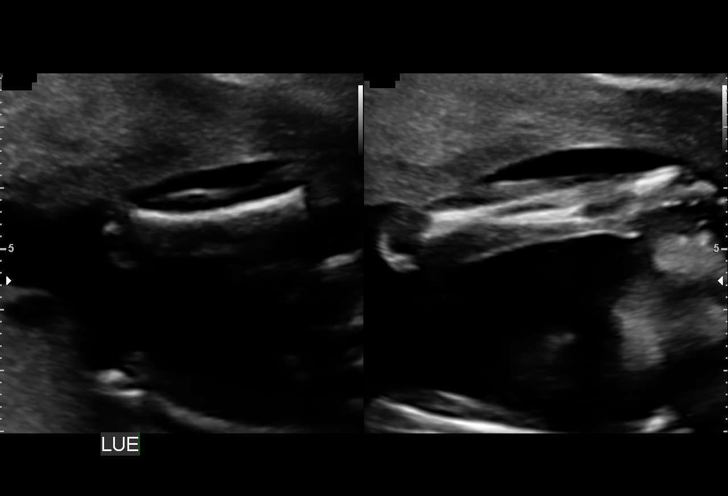
[im 49/67]
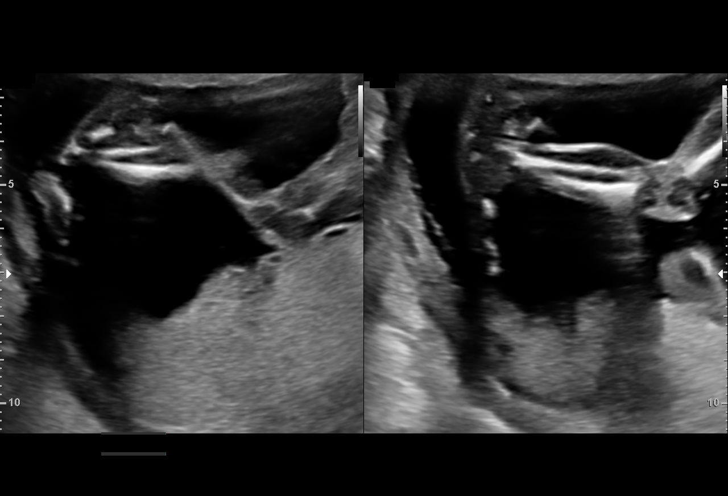
[im 54/67]
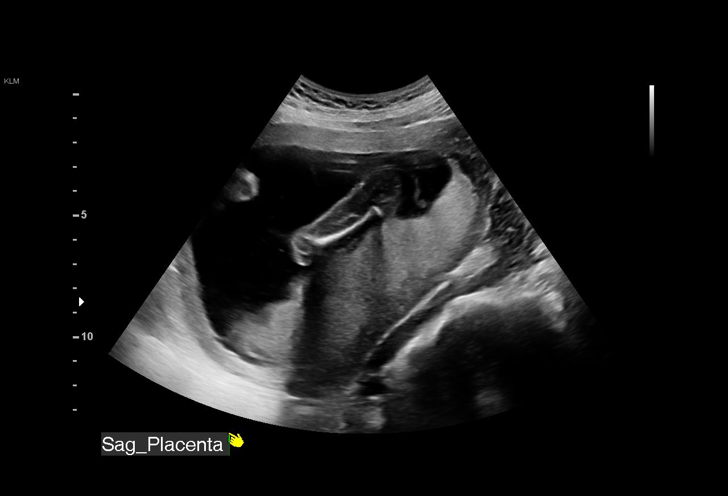
[im 59/67]
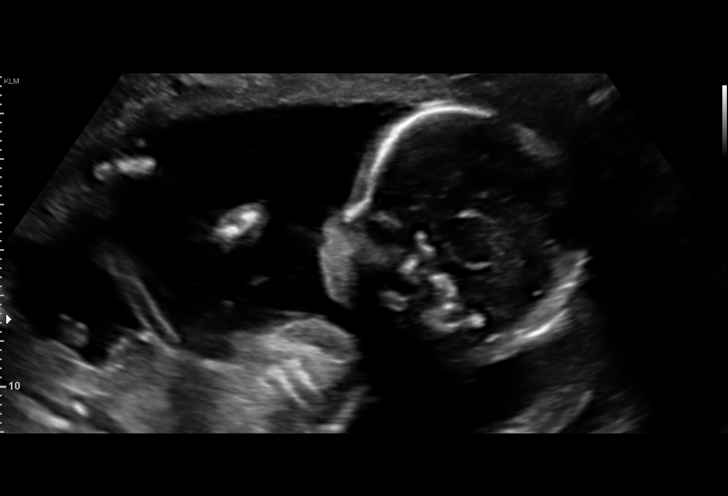
[im 64/67]
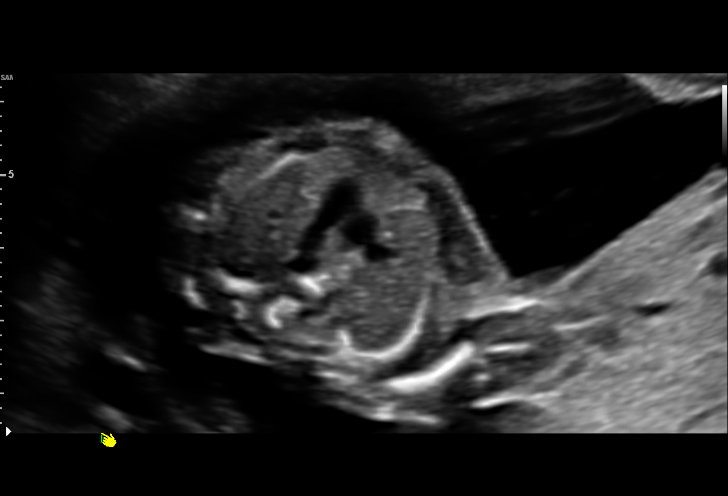

[13 of 28 positions shown; findings below may reference images not displayed]

----------------------------------------------------------------------

 ----------------------------------------------------------------------
Indications

  Encounter for antenatal screening for
  malformations
  18 weeks gestation of pregnancy
  Previous cesarean delivery, antepartum
 ----------------------------------------------------------------------
Vital Signs

 BMI:
Fetal Evaluation

 Num Of Fetuses:         1
 Fetal Heart Rate(bpm):  148
 Cardiac Activity:       Observed
 Presentation:           Cephalic
 Placenta:               Posterior
 P. Cord Insertion:      Visualized

 Amniotic Fluid
 AFI FV:      Within normal limits
Biometry

 BPD:      44.1  mm     G. Age:  19w 2d         71  %    CI:        68.58   %    70 - 86
                                                         FL/HC:      18.3   %    16.1 -
 HC:      170.2  mm     G. Age:  19w 4d         78  %    HC/AC:      1.23        1.09 -
 AC:      138.5  mm     G. Age:  19w 2d         59  %    FL/BPD:     70.5   %
 FL:       31.1  mm     G. Age:  19w 4d         72  %    FL/AC:      22.5   %    20 - 24
 HUM:      30.1  mm     G. Age:  20w 0d         78  %
 CER:        20  mm     G. Age:  19w 0d         56  %

 Est. FW:     294  gm    0 lb 10 oz      55  %
OB History

 Gravidity:    2         Term:   1        Prem:   0        SAB:   0
 TOP:          0       Ectopic:  0        Living: 1
Gestational Age

 LMP:           18w 6d        Date:  05/06/18                 EDD:   02/10/19
 U/S Today:     19w 3d                                        EDD:   02/06/19
 Best:          18w 6d     Det. By:  LMP  (05/06/18)          EDD:   02/10/19
Anatomy

 Cranium:               Appears normal         Aortic Arch:            Appears normal
 Cavum:                 Appears normal         Ductal Arch:            Appears normal
 Ventricles:            Appears normal         Diaphragm:              Appears normal
 Choroid Plexus:        Appears normal         Stomach:                Appears normal, left
                                                                       sided
 Cerebellum:            Appears normal         Abdomen:                Appears normal
 Posterior Fossa:       Appears normal         Abdominal Wall:         Appears nml (cord
                                                                       insert, abd wall)
 Nuchal Fold:           Appears normal         Cord Vessels:           Appears normal (3
                                                                       vessel cord)
 Face:                  Appears normal         Kidneys:                Appear normal
                        (orbits and profile)
 Lips:                  Appears normal         Bladder:                Appears normal
 Thoracic:              Appears normal         Spine:                  Appears normal
 Heart:                 Appears normal         Upper Extremities:      Appears normal
                        (4CH, axis, and situs
 RVOT:                  Appears normal         Lower Extremities:      Appears normal
 LVOT:                  Appears normal

 Other:  Fetus appears to be a male. Heels and 5th digit visualized. Nasal
         bone visualized.
Cervix Uterus Adnexa

 Cervix
 Length:            4.4  cm.
 Normal appearance by transabdominal scan.

 Adnexa
 No abnormality visualized.
Impression

 We performed fetal anatomy scan. No makers of
 aneuploidies or fetal structural defects are seen. Fetal
 biometry is consistent with her previously-established dates.
 Amniotic fluid is normal and good fetal activity is seen.
 Placenta is posterior and there is no evidence of previa.
Recommendations

 Follow-up scans as clinically indicated.
                 Ceejay, Paulus N

## 2020-06-04 NOTE — Progress Notes (Deleted)
    SUBJECTIVE:   CHIEF COMPLAINT / HPI:   Nexplanon removal/replacement Patient is a pleasant 24 year old female that presents today for Nexplanon removal and replacement.  Patient states that since her Nexplanon was last placed in Feb of this year (12/16/19) she has been getting nerve pain in her arm.  This nerve pain is exacerbated when pressing on the Nexplanon which can be palpated in her upper arm.  Patient states that she also had this issue during her previous Nexplanon and this issue was resolved when the Nexplanon was removed and replaced in a better position.  Patient presents today to have this performed.  PERTINENT  PMH / PSH: None relevant  OBJECTIVE:   There were no vitals taken for this visit.   General: NAD, pleasant, able to participate in exam Cardiac: RRR, no murmurs. Respiratory: No respiratory distress Extremities: Nexplanon palpated in *** upper arm   Nexplanon Removal Procedure Note  PRE-OP DIAGNOSIS: Nexplanon, desire for removal and reinsertion/position change POST-OP DIAGNOSIS: Same  PROCEDURE: Nexplanon Removal  PROCEDURE:  Anesthesia: 2% Lidocaine w/ epinephrine 5 ml  Procedure: Consent obtained. A time-out was performed prior to initiating procedure to be sure of correct patient and location. The area surrounding the Nexplanon was prepared with Choloraprep and draped in the usual sterile manner. The site was anesthetized with ***cc ***% lidocaine. A skin incision was made over the distal aspect of the device. The capsule lysed sharply and the device removed using a hemostat. Hemostasis was assured. The site was dressed with SteriStrips. The patient tolerated the procedure well.   Nexplanon Insertion Procedure Note  Patient was identified. Informed consent was signed, signed copy in chart. A time-out was performed.    The insertion site was identified 8-10 cm (3-4 inches) from the medial epicondyle of the humerus and 3-5 cm (1.25-2 inches) posterior to  (below) the sulcus (groove) between the biceps and triceps muscles of the patient's *** arm and marked. The site was prepped and draped in the usual sterile fashion. Pt was prepped with alcohol swab and then injected with *** cc of ***% lidocaine. The site was prepped with betadine. Nexplanon removed form packaging,  Device confirmed in needle, then inserted full length of needle and withdrawn per handbook instructions. Provider and patient verified presence of the implant in the woman's arm by palpation. Pt insertion site was covered with steristrips/adhesive bandage and pressure bandage. There was minimal blood loss. Patient tolerated procedure well.  Patient was given post procedure instructions and Nexplanon user card with expiration date. Condoms were recommended for STI prevention. Patient was asked to keep the pressure dressing on for 24 hours to minimize bruising and keep the adhesive bandage on for 3-5 days. The patient verbalized understanding of the plan of care and agrees.   Lot # *** Expiration Date***/***/***  ASSESSMENT/PLAN:   No problem-specific Assessment & Plan notes found for this encounter.     Jackelyn Poling, DO [facility]

## 2020-06-05 ENCOUNTER — Ambulatory Visit: Payer: Medicaid Other | Admitting: Family Medicine

## 2020-06-17 ENCOUNTER — Ambulatory Visit (INDEPENDENT_AMBULATORY_CARE_PROVIDER_SITE_OTHER): Payer: Medicaid Other | Admitting: Family Medicine

## 2020-06-17 ENCOUNTER — Encounter: Payer: Self-pay | Admitting: Family Medicine

## 2020-06-17 ENCOUNTER — Other Ambulatory Visit: Payer: Self-pay

## 2020-06-17 DIAGNOSIS — K649 Unspecified hemorrhoids: Secondary | ICD-10-CM

## 2020-06-17 HISTORY — DX: Unspecified hemorrhoids: K64.9

## 2020-06-17 NOTE — Progress Notes (Signed)
    SUBJECTIVE:   CHIEF COMPLAINT / HPI:   Hemorrhoids Patient is a 24 year old female that presents today to discuss history of hemorrhoids. Had them before after second child but now after third child they have come back. Had 2 flare ups in last month. Using Preparation H and witch hazel.  Helps temporarily. No bleeding. Painful, 8/10 pain.  PERTINENT  PMH / PSH: History of hemorrhoids  OBJECTIVE:   BP 108/64   Pulse 92   Ht 5\' 3"  (1.6 m)   Wt 153 lb (69.4 kg)   LMP 06/14/2020 (Exact Date)   SpO2 99%   Breastfeeding No   BMI 27.10 kg/m    General: NAD, pleasant, able to participate in exam Respiratory: No respiratory distress Patient requested to opt out of a exam due to being on her menstrual cycle and requested waiting to early later date if her symptoms did not improve  ASSESSMENT/PLAN:   Hemorrhoids Assessment: Patient with a history of hemorrhoids during pregnancy presenting with hemorrhoids that started a few weeks ago.  She states that she has had 2 flareups in the last month.  Previously these improved after pregnancy but she has changed her job and has been on her feet more often lately and thinks this may have something to do with it.  She has been using Preparation H and witch hazel which have improved her symptoms temporarily but she continues to have discomfort.  She denies bleeding.  Rates her pain as an 8 out of 10 which is constant and not worse at bowel movements.  States she feels confident she does not have a anal fissure but opts out of physical exam today. Plan: -Discussed etiology of hemorrhoids and treatments which can include sitz baths, reducing constipation, continuing with Preparation H and witch hazel, maintaining good hydration and exercise, avoiding long periods sitting. -Discussed with patient that if these do not improve the symptoms she should make a follow-up appointment or we can do a full exam with consideration for referral to colorectal surgery  if she has a thrombosed hemorrhoid.  Patient states she does not believe she has a thrombosed hemorrhoid as they seem to be reducible on palpation.   -Recommended patient consider make a follow-up appointment to have her Nexplanon removed and replaced as this has been giving her problems with nerve pain in her arm as discussed in previous appointment.  06/16/2020, DO [facility]

## 2020-06-17 NOTE — Patient Instructions (Signed)
It was great to see you!  Our plans for today:  -Today we discussed hemorrhoids and various treatments for this.  The mainstay of treatment for hemorrhoids are sitz bath's as this seems to provide the most relief.  Once they are relieved it is important to focus on minimizing constipation, drinking lots of water, making sure that you have a soft bowel movement daily, and trying to stay active as all of these things can prevent them from coming back. -If you have a thrombosed hemorrhoid, hemorrhoid with a clot in it, we can get you a referral to colorectal surgery who can have them excised, or removed. -Feel free to make a follow-up appointment to have her Nexplanon removed and replaced at a time that is convenient for you.  Take care and seek immediate care sooner if you develop any concerns.   Dr. [facility]   How to Take a Sitz Bath A sitz bath is a warm water bath that may be used to care for your rectum, genital area, or the area between your rectum and genitals (perineum). For a sitz bath, the water only comes up to your hips and covers your buttocks. A sitz bath may done at home in a bathtub or with a portable sitz bath that fits over the toilet. Your health care provider may recommend a sitz bath to help: Relieve pain and discomfort after delivering a baby. Relieve pain and itching from hemorrhoids or anal fissures. Relieve pain after certain surgeries. Relax muscles that are sore or tight. How to take a sitz bath Take 3-4 sitz baths a day, or as many as told by your health care provider. Bathtub sitz bath To take a sitz bath in a bathtub: Partially fill a bathtub with warm water. The water should be deep enough to cover your hips and buttocks when you are sitting in the tub. If your health care provider told you to put medicine in the water, follow his or her instructions. Sit in the water. Open the tub drain a little, and leave it open during your bath. Turn on  the warm water again, enough to replace the water that is draining out. Keep the water running throughout your bath. This helps keep the water at the right level and the right temperature. Soak in the water for 15-20 minutes, or as long as told by your health care provider. When you are done, be careful when you stand up. You may feel dizzy. After the sitz bath, pat yourself dry. Do not rub your skin to dry it.  Over-the-toilet sitz bath To take a sitz bath with an over-the-toilet basin: Follow the manufacturer's instructions. Fill the basin with warm water. If your health care provider told you to put medicine in the water, follow his or her instructions. Sit on the seat. Make sure the water covers your buttocks and perineum. Soak in the water for 15-20 minutes, or as long as told by your health care provider. After the sitz bath, pat yourself dry. Do not rub your skin to dry it. Clean and dry the basin between uses. Discard the basin if it cracks, or according to the manufacturer's instructions. Contact a health care provider if: Your symptoms get worse. Do not continue with sitz baths if your symptoms get worse. You have new symptoms. If this happens, do not continue with sitz baths until you talk with your health care provider. Summary A sitz bath is a warm water bath in which the  water only comes up to your hips and covers your buttocks. A sitz bath may help relieve itching, relieve pain, and relax muscles that are sore or tight in the lower part of your body, including your genital area. Take 3-4 sitz baths a day, or as many as told by your health care provider. Soak in the water for 15-20 minutes. Do not continue with sitz baths if your symptoms get worse. This information is not intended to replace advice given to you by your health care provider. Make sure you discuss any questions you have with your health care provider. Document Revised: 03/13/2019 Document Reviewed:  10/14/2017 Elsevier Patient Education  2020 [employer].  Hemorrhoids Hemorrhoids are swollen veins that may develop:  In the butt (rectum). These are called internal hemorrhoids.  Around the opening of the butt (anus). These are called external hemorrhoids. Hemorrhoids can cause pain, itching, or bleeding. Most of the time, they do not cause serious problems. They usually get better with diet changes, lifestyle changes, and other home treatments. What are the causes? This condition may be caused by:  Having trouble pooping (constipation).  Pushing hard (straining) to poop.  Watery poop (diarrhea).  Pregnancy.  Being very overweight (obese).  Sitting for long periods of time.  Heavy lifting or other activity that causes you to strain.  Anal sex.  Riding a bike for a long period of time. What are the signs or symptoms? Symptoms of this condition include:  Pain.  Itching or soreness in the butt.  Bleeding from the butt.  Leaking poop.  Swelling in the area.  One or more lumps around the opening of your butt. How is this diagnosed? A doctor can often diagnose this condition by looking at the affected area. The doctor may also:  Do an exam that involves feeling the area with a gloved hand (digital rectal exam).  Examine the area inside your butt using a small tube (anoscope).  Order blood tests. This may be done if you have lost a lot of blood.  Have you get a test that involves looking inside the colon using a flexible tube with a camera on the end (sigmoidoscopy or colonoscopy). How is this treated? This condition can usually be treated at home. Your doctor may tell you to change what you eat, make lifestyle changes, or try home treatments. If these do not help, procedures can be done to remove the hemorrhoids or make them smaller. These may involve:  Placing rubber bands at the base of the hemorrhoids to cut off their blood supply.  Injecting medicine into  the hemorrhoids to shrink them.  Shining a type of light energy onto the hemorrhoids to cause them to fall off.  Doing surgery to remove the hemorrhoids or cut off their blood supply. Follow these instructions at home: Eating and drinking   Eat foods that have a lot of fiber in them. These include whole grains, beans, nuts, fruits, and vegetables.  Ask your doctor about taking products that have added fiber (fibersupplements).  Reduce the amount of fat in your diet. You can do this by: ? Eating low-fat dairy products. ? Eating less red meat. ? Avoiding processed foods.  Drink enough fluid to keep your pee (urine) pale yellow. Managing pain and swelling   Take a warm-water bath (sitz bath) for 20 minutes to ease pain. Do this 3-4 times a day. You may do this in a bathtub or using a portable sitz bath that fits over the  toilet.  If told, put ice on the painful area. It may be helpful to use ice between your warm baths. ? Put ice in a plastic bag. ? Place a towel between your skin and the bag. ? Leave the ice on for 20 minutes, 2-3 times a day. General instructions  Take over-the-counter and prescription medicines only as told by your doctor. ? Medicated creams and medicines may be used as told.  Exercise often. Ask your doctor how much and what kind of exercise is best for you.  Go to the bathroom when you have the urge to poop. Do not wait.  Avoid pushing too hard when you poop.  Keep your butt dry and clean. Use wet toilet paper or moist towelettes after pooping.  Do not sit on the toilet for a long time.  Keep all follow-up visits as told by your doctor. This is important. Contact a doctor if you:  Have pain and swelling that do not get better with treatment or medicine.  Have trouble pooping.  Cannot poop.  Have pain or swelling outside the area of the hemorrhoids. Get help right away if you have:  Bleeding that will not stop. Summary  Hemorrhoids are  swollen veins in the butt or around the opening of the butt.  They can cause pain, itching, or bleeding.  Eat foods that have a lot of fiber in them. These include whole grains, beans, nuts, fruits, and vegetables.  Take a warm-water bath (sitz bath) for 20 minutes to ease pain. Do this 3-4 times a day. This information is not intended to replace advice given to you by your health care provider. Make sure you discuss any questions you have with your health care provider. Document Revised: 10/20/2018 Document Reviewed: 03/03/2018 Elsevier Patient Education  2020 [employer].

## 2020-06-17 NOTE — Assessment & Plan Note (Signed)
Assessment: Patient with a history of hemorrhoids during pregnancy presenting with hemorrhoids that started a few weeks ago.  She states that she has had 2 flareups in the last month.  Previously these improved after pregnancy but she has changed her job and has been on her feet more often lately and thinks this may have something to do with it.  She has been using Preparation H and witch hazel which have improved her symptoms temporarily but she continues to have discomfort.  She denies bleeding.  Rates her pain as an 8 out of 10 which is constant and not worse at bowel movements.  States she feels confident she does not have a anal fissure but opts out of physical exam today. Plan: -Discussed etiology of hemorrhoids and treatments which can include sitz baths, reducing constipation, continuing with Preparation H and witch hazel, maintaining good hydration and exercise, avoiding long periods sitting. -Discussed with patient that if these do not improve the symptoms she should make a follow-up appointment or we can do a full exam with consideration for referral to colorectal surgery if she has a thrombosed hemorrhoid.  Patient states she does not believe she has a thrombosed hemorrhoid as they seem to be reducible on palpation.

## 2020-07-09 ENCOUNTER — Ambulatory Visit: Payer: Medicaid Other | Admitting: Family Medicine

## 2020-07-25 ENCOUNTER — Ambulatory Visit (INDEPENDENT_AMBULATORY_CARE_PROVIDER_SITE_OTHER): Payer: BC Managed Care – PPO | Admitting: Family Medicine

## 2020-07-25 ENCOUNTER — Other Ambulatory Visit: Payer: Self-pay

## 2020-07-25 VITALS — BP 108/64 | HR 71 | Ht 63.0 in | Wt 155.8 lb

## 2020-07-25 DIAGNOSIS — Z3041 Encounter for surveillance of contraceptive pills: Secondary | ICD-10-CM

## 2020-07-25 DIAGNOSIS — Z3046 Encounter for surveillance of implantable subdermal contraceptive: Secondary | ICD-10-CM | POA: Diagnosis not present

## 2020-07-25 MED ORDER — NORGESTIM-ETH ESTRAD TRIPHASIC 0.18/0.215/0.25 MG-25 MCG PO TABS: 1.0000 | ORAL_TABLET | Freq: Every day | ORAL | 0 refills | Status: DC

## 2020-07-25 NOTE — Patient Instructions (Signed)
It was wonderful to see you today.  Today we removed your nexplanon. Please let us know if you continue to have irregular bleeding. Let us know if you have any excessive bleeding or drainage at the removal site.   Please call the clinic at [phone] if you have any concerns. It was our pleasure to serve you.  Dr. Salvadore Dom

## 2020-07-25 NOTE — Progress Notes (Signed)
    SUBJECTIVE:   CHIEF COMPLAINT / HPI:   [name] is a 24 yo F who presents to colpo clinic for a Nexplanon removal.   Nexplanon Removal Placed february of this year and continues to have irregular bleeding and spotting. She is also endorsing tingling and numbness in her hand when insertion is manipulated. Attempted OCPs to help regulate her menstrual cycle but without resolution. Would like it removed today and will continue with OCPs.   PERTINENT  PMH / PSH: Hx of anemia  OBJECTIVE:   BP 108/64   Pulse 71   Ht 5\' 3"  (1.6 m)   Wt 155 lb 12.8 oz (70.7 kg)   SpO2 99%   BMI 27.60 kg/m   PROCEDURE NOTE: NEXPLANON  REMOVAL Patient given informed consent and signed copy in the chart. Right arm area prepped and draped in the usual sterile fashion. Three cc of lidocaine without epinephrine 1% used for local anesthesia. A small stab incision was made close to the nexplanon with scalpel. Hemostats were used to withdraw the nexplanon. A small bandage was applied over a steri strip  No complications.  ASSESSMENT/PLAN:   Nexplanon removal Tolerated well by patient. Patient given follow up instructions should she experience redness, swelling at sight or fever in the next 24 hours. Patient was reminded this totally removes her nexplanon contraceptive devise.  Encounter for surveillance of contraceptive pills -Continue OCPs; Rx refill to pharmacy today.   , DO [facility]

## 2020-07-25 NOTE — Assessment & Plan Note (Signed)
Tolerated well by patient. Patient given follow up instructions should she experience redness, swelling at sight or fever in the next 24 hours. Patient was reminded this totally removes her nexplanon contraceptive devise.

## 2020-07-25 NOTE — Assessment & Plan Note (Addendum)
-  Continue OCPs; Rx refill to pharmacy today.

## 2020-07-29 ENCOUNTER — Ambulatory Visit: Payer: Medicaid Other | Admitting: Family Medicine

## 2020-08-07 ENCOUNTER — Encounter: Payer: Self-pay | Admitting: Family Medicine

## 2020-08-07 ENCOUNTER — Other Ambulatory Visit: Payer: Self-pay

## 2020-08-07 ENCOUNTER — Ambulatory Visit (INDEPENDENT_AMBULATORY_CARE_PROVIDER_SITE_OTHER): Payer: BC Managed Care – PPO | Admitting: Family Medicine

## 2020-08-07 VITALS — BP 118/72 | HR 71 | Ht 63.5 in | Wt 154.6 lb

## 2020-08-07 DIAGNOSIS — K641 Second degree hemorrhoids: Secondary | ICD-10-CM | POA: Diagnosis not present

## 2020-08-07 DIAGNOSIS — O99019 Anemia complicating pregnancy, unspecified trimester: Secondary | ICD-10-CM | POA: Diagnosis not present

## 2020-08-07 MED ORDER — IRON 325 (65 FE) MG PO TABS: 1.0000 | ORAL_TABLET | Freq: Every day | ORAL | 0 refills | Status: AC

## 2020-08-07 MED ORDER — HYDROCORTISONE ACETATE 25 MG RE SUPP: 25.0000 mg | Freq: Two times a day (BID) | RECTAL | 3 refills | Status: AC

## 2020-08-07 MED ORDER — HYDROCORTISONE ACETATE 1 % EX OINT: 1.0000 g | TOPICAL_OINTMENT | Freq: Two times a day (BID) | CUTANEOUS | 3 refills | Status: AC

## 2020-08-07 NOTE — Patient Instructions (Signed)
Thank you for coming in to see Linda Keith today! Please see below to review our plan for today's visit:  1. Use either the supoository OR the cream to the rectal area twice daily. Do this persistently for at least 1 week. 2. You can try soaking in an Epsom salt bath twice daily as time permits.  3. You can try miralax added to a drink 1-2 times daily in order to thin stools. If you're having diarrhea then cut back! 4. Remember - no phones or reading material in the bathroom.   Please call the clinic at [phone] if your symptoms worsen or you have any concerns. It was our pleasure to serve you!   Dr. Peggyann Shoals [facility]

## 2020-08-07 NOTE — Assessment & Plan Note (Addendum)
-  Patient states recent MCV 74 -Represcribed iron to be taken every other day

## 2020-08-07 NOTE — Assessment & Plan Note (Signed)
Patient describing hemorrhoids that are both external and grade 2 internal and that they spontaneously reduced.  Patient has tried over-the-counter Preparation H, has not tried any other remedies. -Patient prescribed Anusol suppositories and topical cream that she can try each of them he would when she likes.  I recommended that she apply one of them twice daily. -Recommended MiraLAX to help thin her sleep, prevent constipation (especially while on iron supplement) -Recommended sitz bath with Epsom salts twice daily -Instructed patient to not take any sort of "reading anterior" into the restroom as she is not staying on the toilet for prolonged periods of time -Also instructed patient she can take a wet washcloth" in the refrigerator, and take it out and roll it up using that as a "cold compress" to her bottom. -Patient instructed that next steps would be consult with GI/general surgery

## 2020-08-07 NOTE — Progress Notes (Signed)
    SUBJECTIVE:   CHIEF COMPLAINT / HPI:   Hemorrhoids: Patient reports having hemorrhoids which she believes have become worse with her second pregnancy.  She also has an 98-month-old son.  She reports feeling 2-3 painful, often inflamed bumps around her rectal area.  She says that she works for [employer] in [place] and feels that her swelling is worse at the end of a long day.  He has tried over-the-counter Preparation H occasionally which has provided her with good relief, but the hemorrhoids swell up again.  She denies any bleeding from the sites.  PERTINENT  PMH / PSH:  Patient Active Problem List   Diagnosis Date Noted  . Grade II hemorrhoids 08/07/2020  . Nexplanon removal 07/25/2020  . Encounter for surveillance of contraceptive pills 07/25/2020  . Hemorrhoids 06/17/2020  . Irregular uterine bleeding 05/08/2020  . Night sweats 05/08/2020  . Nerve pain 05/08/2020  . Routine general medical examination at a health care facility 03/13/2020  . Placental abruption 12/15/2019  . Short interval between pregnancies affecting pregnancy in third trimester, antepartum 11/30/2019  . Anemia during pregnancy 07/07/2019  . Normal labor 01/22/2019  . VBAC (vaginal birth after Cesarean) 01/22/2019  . Previous cesarean delivery affecting pregnancy, antepartum 12/12/2018  . History of sexually transmitted disease 12/24/2017  . Supervision of low-risk pregnancy 08/07/2014     OBJECTIVE:   BP 118/72   Pulse 71   Ht 5' 3.5" (1.613 m)   Wt 154 lb 9.6 oz (70.1 kg)   SpO2 96%   BMI 26.96 kg/m    Physical exam: General: Well-appearing patient, very pleasant Respiratory: Comfortable work of breathing, speaking complete sentences Rectal exam: Deferred   ASSESSMENT/PLAN:   Anemia during pregnancy -Patient states recent MCV 74 -Represcribed iron to be taken every other day  Grade II hemorrhoids Patient describing hemorrhoids that are both external and grade 2 internal and that they  spontaneously reduced.  Patient has tried over-the-counter Preparation H, has not tried any other remedies. -Patient prescribed Anusol suppositories and topical cream that she can try each of them he would when she likes.  I recommended that she apply one of them twice daily. -Recommended MiraLAX to help thin her sleep, prevent constipation (especially while on iron supplement) -Recommended sitz bath with Epsom salts twice daily -Instructed patient to not take any sort of "reading anterior" into the restroom as she is not staying on the toilet for prolonged periods of time -Also instructed patient she can take a wet washcloth" in the refrigerator, and take it out and roll it up using that as a "cold compress" to her bottom. -Patient instructed that next steps would be consult with GI/general surgery     Dollene Cleveland, DO [facility]

## 2020-08-14 ENCOUNTER — Other Ambulatory Visit: Payer: Self-pay

## 2020-08-14 DIAGNOSIS — Z20822 Contact with and (suspected) exposure to covid-19: Secondary | ICD-10-CM

## 2020-08-15 LAB — NOVEL CORONAVIRUS, NAA: SARS-CoV-2, NAA: NOT DETECTED

## 2020-08-15 LAB — SARS-COV-2, NAA 2 DAY TAT

## 2020-09-03 ENCOUNTER — Encounter (HOSPITAL_COMMUNITY): Payer: Self-pay | Admitting: Emergency Medicine

## 2020-09-03 ENCOUNTER — Ambulatory Visit (HOSPITAL_COMMUNITY)
Admission: EM | Admit: 2020-09-03 | Discharge: 2020-09-03 | Disposition: A | Discharge status: Home / Self Care | Payer: BC Managed Care – PPO | Attending: Family Medicine | Admitting: Family Medicine

## 2020-09-03 ENCOUNTER — Other Ambulatory Visit: Payer: Self-pay

## 2020-09-03 DIAGNOSIS — Z20822 Contact with and (suspected) exposure to covid-19: Secondary | ICD-10-CM | POA: Diagnosis present

## 2020-09-03 DIAGNOSIS — R059 Cough, unspecified: Secondary | ICD-10-CM

## 2020-09-03 LAB — SARS CORONAVIRUS 2 (TAT 6-24 HRS): SARS Coronavirus 2: NEGATIVE

## 2020-09-03 MED ORDER — BENZONATATE 100 MG PO CAPS: 100.0000 mg | ORAL_CAPSULE | Freq: Three times a day (TID) | ORAL | 0 refills | Status: DC | PRN

## 2020-09-03 MED ORDER — PROMETHAZINE-DM 6.25-15 MG/5ML PO SYRP
5.0000 mL | ORAL_SOLUTION | Freq: Every evening | ORAL | 0 refills | Status: DC | PRN
Start: 2020-09-03 — End: 2020-10-24

## 2020-09-03 MED ORDER — CETIRIZINE HCL 10 MG PO TABS: 10.0000 mg | ORAL_TABLET | Freq: Every day | ORAL | 0 refills | Status: DC

## 2020-09-03 MED ORDER — PSEUDOEPHEDRINE HCL 30 MG PO TABS: 30.0000 mg | ORAL_TABLET | Freq: Three times a day (TID) | ORAL | 0 refills | Status: DC | PRN

## 2020-09-03 NOTE — ED Triage Notes (Signed)
PT reports she was with someone who was COVID positive Sunday. She began coughing yesterday. Needs COVID test and note for work.

## 2020-09-03 NOTE — ED Provider Notes (Signed)
[facility]   MRN: [id number] DOB: 1996/04/27  Subjective:   Linda Keith is a 24 y.o. female presenting for COVID-19 testing.  Patient had very close exposure at work on Sunday.  She started having a cough yesterday.  Denies fever, chest pain, shortness of breath, loss of sense of taste and smell.  She is not Covid vaccinated.  No current facility-administered medications for this encounter.  Current Outpatient Medications:  .  Ferrous Sulfate (IRON) 325 (65 Fe) MG TABS, Take 1 tablet (325 mg total) by mouth daily., Disp: 30 tablet, Rfl: 0 .  hydrocortisone (ANUSOL-HC) 25 MG suppository, Place 1 suppository (25 mg total) rectally 2 (two) times daily., Disp: 24 suppository, Rfl: 3 .  Hydrocortisone Acetate 1 % OINT, Apply 1 g topically in the morning and at bedtime., Disp: 28.4 g, Rfl: 3 .  Norgestimate-Ethinyl Estradiol Triphasic (ORTHO TRI-CYCLEN LO) 0.18/0.215/0.25 MG-25 MCG tab, Take 1 tablet by mouth daily., Disp: 28 tablet, Rfl: 0 .  polyethylene glycol powder (GLYCOLAX/MIRALAX) 17 GM/SCOOP powder, Take 17 g by mouth 2 (two) times daily as needed for moderate constipation., Disp: 3350 g, Rfl: 1 .  Prenatal Vit-Fe Fumarate-FA (PRENATAL 19) tablet, Chew 1 tablet by mouth daily., Disp: 30 tablet, Rfl: 11 .  sertraline (ZOLOFT) 50 MG tablet, Take 1 tablet (50 mg total) by mouth daily. (Patient not taking: Reported on 11/20/2019), Disp: 30 tablet, Rfl: 3   No Known Allergies  Past Medical History:  Diagnosis Date  . Anemia   . Depression    had pp depression which improved without meds  . Prenatal care 08/07/2014    Nursing Staff Provider Office Location  [facility] Dating  LMP:05/06/18 Language  English Anatomy US   normal Flu Vaccine  08/02/18 Genetic Screen  Quad: WNL  TDaP vaccine    Hgb A1C or  GTT Early  Third trimester 112 Rhogam  NA   LAB RESULTS  Feeding Plan  Blood Type O/Positive/-- (09/04 1459) O+ Contraception  Antibody Negative (09/04 1459)Negative  Circumcision  yes Rubella 4.26 (09/04 1459) 4.26 immune     Past Surgical History:  Procedure Laterality Date  . CESAREAN SECTION N/A 11/14/2014   Procedure: CESAREAN SECTION;  Surgeon: Catalina Antigua, MD;  Location: [facility];  Service: Obstetrics;  Laterality: N/A;    Family History  Problem Relation Age of Onset  . Cardiomyopathy Mother   . Peripheral Artery Disease Mother     Social History   Tobacco Use  . Smoking status: Never Smoker  . Smokeless tobacco: Never Used  Vaping Use  . Vaping Use: Never used  Substance Use Topics  . Alcohol use: Yes    Alcohol/week: 2.0 standard drinks    Types: 2 Glasses of wine per week  . Drug use: No    ROS   Objective:   Vitals: There were no vitals taken for this visit.  Physical Exam Constitutional:      General: She is not in acute distress.    Appearance: Normal appearance. She is well-developed. She is not ill-appearing, toxic-appearing or diaphoretic.  HENT:     Head: Normocephalic and atraumatic.     Nose: Nose normal.     Mouth/Throat:     Mouth: Mucous membranes are moist.  Eyes:     Extraocular Movements: Extraocular movements intact.     Pupils: Pupils are equal, round, and reactive to light.  Cardiovascular:     Rate and Rhythm: Normal rate and regular rhythm.  Pulses: Normal pulses.     Heart sounds: Normal heart sounds. No murmur heard.  No friction rub. No gallop.   Pulmonary:     Effort: Pulmonary effort is normal. No respiratory distress.     Breath sounds: Normal breath sounds. No stridor. No wheezing, rhonchi or rales.  Skin:    General: Skin is warm and dry.     Findings: No rash.  Neurological:     Mental Status: She is alert and oriented to person, place, and time.  Psychiatric:        Mood and Affect: Mood normal.        Behavior: Behavior normal.        Thought Content: Thought content normal.     Assessment and Plan :   PDMP not reviewed this encounter.  1. Cough   2. Close  exposure to COVID-19 virus     Will manage for viral illness such as viral URI, viral syndrome, viral rhinitis, COVID-19. Counseled patient on nature of COVID-19 including modes of transmission, diagnostic testing, management and supportive care.  Offered scripts for symptomatic relief. COVID 19 testing is pending. Counseled patient on potential for adverse effects with medications prescribed/recommended today, ER and return-to-clinic precautions discussed, patient verbalized understanding.     Wallis Bamberg, PA-C 09/03/20 1451

## 2020-09-03 NOTE — Discharge Instructions (Signed)

## 2020-09-24 ENCOUNTER — Other Ambulatory Visit: Payer: Self-pay

## 2020-09-24 ENCOUNTER — Emergency Department (HOSPITAL_COMMUNITY)
Admission: EM | Admit: 2020-09-24 | Discharge: 2020-09-25 | Disposition: A | Discharge status: Home / Self Care | Payer: BC Managed Care – PPO | Attending: Emergency Medicine | Admitting: Emergency Medicine

## 2020-09-24 ENCOUNTER — Encounter (HOSPITAL_COMMUNITY): Payer: Self-pay | Admitting: Emergency Medicine

## 2020-09-24 DIAGNOSIS — M25562 Pain in left knee: Secondary | ICD-10-CM | POA: Insufficient documentation

## 2020-09-24 DIAGNOSIS — Z5321 Procedure and treatment not carried out due to patient leaving prior to being seen by health care provider: Secondary | ICD-10-CM | POA: Diagnosis not present

## 2020-09-24 NOTE — ED Triage Notes (Signed)
Pt c/o left knee pain and swelling x's 2 days,  No known injury

## 2020-09-25 ENCOUNTER — Ambulatory Visit (HOSPITAL_COMMUNITY)
Admission: EM | Admit: 2020-09-25 | Discharge: 2020-09-25 | Disposition: A | Discharge status: Home / Self Care | Payer: BC Managed Care – PPO | Attending: Emergency Medicine | Admitting: Emergency Medicine

## 2020-09-25 ENCOUNTER — Other Ambulatory Visit: Payer: Self-pay

## 2020-09-25 ENCOUNTER — Emergency Department (HOSPITAL_COMMUNITY): Payer: BC Managed Care – PPO

## 2020-09-25 ENCOUNTER — Ambulatory Visit: Payer: BC Managed Care – PPO

## 2020-09-25 ENCOUNTER — Encounter (HOSPITAL_COMMUNITY): Payer: Self-pay

## 2020-09-25 DIAGNOSIS — M25562 Pain in left knee: Secondary | ICD-10-CM | POA: Diagnosis not present

## 2020-09-25 MED ORDER — MELOXICAM 15 MG PO TABS: 15.0000 mg | ORAL_TABLET | Freq: Every day | ORAL | 0 refills | Status: DC

## 2020-09-25 NOTE — Discharge Instructions (Signed)
Inserts in your shoes to provide heel support.  Ice application to knee after activity.  Use of brace as provided, particularly when active.  Activity as tolerated, see exercises provided.  Meloxicam daily. Don't take additional ibuprofen for aleve. Take with food.  Please follow up with sports medicine for further evaluation and management.

## 2020-09-25 NOTE — Progress Notes (Deleted)
    SUBJECTIVE:   CHIEF COMPLAINT / HPI:   ***  PERTINENT  PMH / PSH: ***  OBJECTIVE:   LMP 09/12/2020 (Exact Date)   ***  ASSESSMENT/PLAN:   No problem-specific Assessment & Plan notes found for this encounter.     Sandre Kitty, MD [facility]

## 2020-09-25 NOTE — ED Triage Notes (Signed)
Pt presents with left knee X 2 days from unknown source with no known injury.

## 2020-09-25 NOTE — ED Notes (Signed)
Patient no longer wanted to be seen and has left.

## 2020-09-25 NOTE — ED Provider Notes (Signed)
[facility]    CSN: [id number] Arrival date & time: 09/25/20  0630      History   Chief Complaint Chief Complaint  Patient presents with   Knee Pain    HPI Linda Keith is a 24 y.o. female.   Linda Keith presents with complaints of left knee pain and swelling, worse since 11/25. It is worse when she stands on her feet for long period of time, particularly at work. When she is active it feels improved.  No specific known injury. No redness or warmth to the knee. Had similar issues with left knee when she participated in sports in high school. Took ibuprofen which minimally helped. She went to the ER last night and had imaging done which was unremarkable, but left prior to being seen by a provider.   ROS per HPI, negative if not otherwise mentioned.       Past Medical History:  Diagnosis Date   Anemia    Depression    had pp depression which improved without meds   Prenatal care 08/07/2014    Nursing Staff Provider Office Location  [facility] Dating  LMP:05/06/18 Language  English Anatomy US   normal Flu Vaccine  08/02/18 Genetic Screen  Quad: WNL  TDaP vaccine    Hgb A1C or  GTT Early  Third trimester 112 Rhogam  NA   LAB RESULTS  Feeding Plan  Blood Type O/Positive/-- (09/04 1459) O+ Contraception  Antibody Negative (09/04 1459)Negative Circumcision  yes Rubella 4.26 (09/04 1459) 4.26 immune    Patient Active Problem List   Diagnosis Date Noted   Grade II hemorrhoids 08/07/2020   Nexplanon removal 07/25/2020   Encounter for surveillance of contraceptive pills 07/25/2020   Hemorrhoids 06/17/2020   Irregular uterine bleeding 05/08/2020   Night sweats 05/08/2020   Nerve pain 05/08/2020   Routine general medical examination at a health care facility 03/13/2020   Placental abruption 12/15/2019   Short interval between pregnancies affecting pregnancy in third trimester, antepartum 11/30/2019   Anemia during pregnancy 07/07/2019   Normal labor  01/22/2019   VBAC (vaginal birth after Cesarean) 01/22/2019   Previous cesarean delivery affecting pregnancy, antepartum 12/12/2018   History of sexually transmitted disease 12/24/2017   Supervision of low-risk pregnancy 08/07/2014    Past Surgical History:  Procedure Laterality Date   CESAREAN SECTION N/A 11/14/2014   Procedure: CESAREAN SECTION;  Surgeon: Catalina Antigua, MD;  Location: [facility];  Service: Obstetrics;  Laterality: N/A;    OB History    Gravida  3   Para  3   Term  3   Preterm      AB      Living  3     SAB      TAB      Ectopic      Multiple  0   Live Births  3            Home Medications    Prior to Admission medications   Medication Sig Start Date End Date Taking? Authorizing Provider  benzonatate (TESSALON) 100 MG capsule Take 1-2 capsules (100-200 mg total) by mouth 3 (three) times daily as needed for cough. 09/03/20   Wallis Bamberg, PA-C  cetirizine (ZYRTEC ALLERGY) 10 MG tablet Take 1 tablet (10 mg total) by mouth daily. 09/03/20   Wallis Bamberg, PA-C  Ferrous Sulfate (IRON) 325 (65 Fe) MG TABS Take 1 tablet (325 mg total) by mouth daily. 08/07/20   Dollene Cleveland,  DO  hydrocortisone (ANUSOL-HC) 25 MG suppository Place 1 suppository (25 mg total) rectally 2 (two) times daily. 08/07/20   Dollene Cleveland, DO  Hydrocortisone Acetate 1 % OINT Apply 1 g topically in the morning and at bedtime. 08/07/20   Dollene Cleveland, DO  meloxicam (MOBIC) 15 MG tablet Take 1 tablet (15 mg total) by mouth daily. 09/25/20   Georgetta Haber, NP  Norgestimate-Ethinyl Estradiol Triphasic (ORTHO TRI-CYCLEN LO) 0.18/0.215/0.25 MG-25 MCG tab Take 1 tablet by mouth daily. 07/25/20   Autry-Lott, Randa Evens, DO  polyethylene glycol powder (GLYCOLAX/MIRALAX) 17 GM/SCOOP powder Take 17 g by mouth 2 (two) times daily as needed for moderate constipation. 11/30/19   Westley Chandler, MD  Prenatal Vit-Fe Fumarate-FA (PRENATAL 19) tablet Chew 1 tablet by mouth daily. 05/04/19    Donette Larry, CNM  promethazine-dextromethorphan (PROMETHAZINE-DM) 6.25-15 MG/5ML syrup Take 5 mLs by mouth at bedtime as needed for cough. 09/03/20   Wallis Bamberg, PA-C  pseudoephedrine (SUDAFED) 30 MG tablet Take 1 tablet (30 mg total) by mouth every 8 (eight) hours as needed for congestion. 09/03/20   Wallis Bamberg, PA-C  sertraline (ZOLOFT) 50 MG tablet Take 1 tablet (50 mg total) by mouth daily. Patient not taking: Reported on 11/20/2019 04/26/19   Garnette Gunner, MD    Family History Family History  Problem Relation Age of Onset   Cardiomyopathy Mother    Peripheral Artery Disease Mother     Social History Social History   Tobacco Use   Smoking status: Never Smoker   Smokeless tobacco: Never Used  Vaping Use   Vaping Use: Never used  Substance Use Topics   Alcohol use: Yes    Alcohol/week: 2.0 standard drinks    Types: 2 Glasses of wine per week   Drug use: No     Allergies   Patient has no known allergies.   Review of Systems Review of Systems   Physical Exam Triage Vital Signs ED Triage Vitals [09/25/20 0958]  Enc Vitals Group     BP 102/82     Pulse Rate 83     Resp 18     Temp 97.9 F (36.6 C)     Temp Source Oral     SpO2 100 %     Weight      Height      Head Circumference      Peak Flow      Pain Score 5     Pain Loc      Pain Edu?      Excl. in GC?    No data found.  Updated Vital Signs BP 102/82 (BP Location: Right Arm)    Pulse 83    Temp 97.9 F (36.6 C) (Oral)    Resp 18    LMP 09/12/2020 (Exact Date)    SpO2 100%   Visual Acuity Right Eye Distance:   Left Eye Distance:   Bilateral Distance:    Right Eye Near:   Left Eye Near:    Bilateral Near:     Physical Exam Constitutional:      General: She is not in acute distress.    Appearance: She is well-developed.  Cardiovascular:     Rate and Rhythm: Normal rate.  Pulmonary:     Effort: Pulmonary effort is normal.  Musculoskeletal:     Left knee: Crepitus present.  No swelling, deformity, effusion, erythema or bony tenderness. Normal range of motion. Tenderness present over the patellar tendon.  Skin:  General: Skin is warm and dry.  Neurological:     Mental Status: She is alert and oriented to person, place, and time.      UC Treatments / Results  Labs (all labs ordered are listed, but only abnormal results are displayed) Labs Reviewed - No data to display  EKG   Radiology DG Knee Complete 4 Views Left  Result Date: 09/25/2020 CLINICAL DATA:  Left knee pain and swelling for 2 days, no known injury, initial encounter EXAM: LEFT KNEE - COMPLETE 4+ VIEW COMPARISON:  None. FINDINGS: No evidence of fracture, dislocation, or joint effusion. No evidence of arthropathy or other focal bone abnormality. Soft tissues are unremarkable. IMPRESSION: No acute abnormality noted. Electronically Signed   By: Alcide Clever M.D.   On: 09/25/2020 00:35    Procedures Procedures (including critical care time)  Medications Ordered in UC Medications - No data to display  Initial Impression / Assessment and Plan / UC Course  I have reviewed the triage vital signs and the nursing notes.  Pertinent labs & imaging results that were available during my care of the patient were reviewed by me and considered in my medical decision making (see chart for details).     Xray from asthma night reviewed and normal. No specific injury. Tendonitis vs strain considered. Crepitus on exam with flexion and extension. No obvious laxity. Discussed work conditions and limited stationary work, use of brace for support, nsaids to help with pain. Follow up with sports medicine recommended. Patient verbalized understanding and agreeable to plan.  Ambulatory out of clinic without difficulty.    Final Clinical Impressions(s) / UC Diagnoses   Final diagnoses:  Acute pain of left knee     Discharge Instructions     Inserts in your shoes to provide heel support.  Ice application to  knee after activity.  Use of brace as provided, particularly when active.  Activity as tolerated, see exercises provided.  Meloxicam daily. Don't take additional ibuprofen for aleve. Take with food.  Please follow up with sports medicine for further evaluation and management.    ED Prescriptions    Medication Sig Dispense Auth. Provider   meloxicam (MOBIC) 15 MG tablet Take 1 tablet (15 mg total) by mouth daily. 30 tablet Georgetta Haber, NP     PDMP not reviewed this encounter.   Georgetta Haber, NP 09/25/20 1054

## 2020-10-03 ENCOUNTER — Ambulatory Visit: Payer: BC Managed Care – PPO | Admitting: Family Medicine

## 2020-10-03 NOTE — Patient Instructions (Incomplete)
It was great to see you! Thank you for allowing me to participate in your care!  Our plans for today:  - *** -   We are checking some labs today, I will call you if they are abnormal will send you a MyChart message or a letter if they are normal.  If you do not hear about your labs in the next 2 weeks please let us know.***  Take care and seek immediate care sooner if you develop any concerns.   Dr. Samy Ryner, DO [facility]  

## 2020-10-03 NOTE — Progress Notes (Deleted)
    SUBJECTIVE:   CHIEF COMPLAINT / HPI:   ***  PERTINENT  PMH / PSH: ***  OBJECTIVE:   LMP 09/12/2020 (Exact Date)    General: NAD, pleasant, able to participate in exam Cardiac: RRR, no murmurs. Respiratory: CTAB, normal effort, No wheezes, rales or rhonchi Abdomen: Bowel sounds present, nontender, nondistended, no hepatosplenomegaly. Extremities: no edema or cyanosis. Skin: warm and dry, no rashes noted Neuro: alert, no obvious focal deficits Psych: Normal affect and mood  ASSESSMENT/PLAN:   No problem-specific Assessment & Plan notes found for this encounter.     Jackelyn Poling, DO [facility]    This note was prepared using Dragon voice recognition software and may include unintentional dictation errors due to the inherent limitations of voice recognition software.

## 2020-10-10 ENCOUNTER — Other Ambulatory Visit: Payer: Self-pay

## 2020-10-10 ENCOUNTER — Ambulatory Visit (INDEPENDENT_AMBULATORY_CARE_PROVIDER_SITE_OTHER): Payer: BC Managed Care – PPO | Admitting: Family Medicine

## 2020-10-10 ENCOUNTER — Ambulatory Visit (HOSPITAL_COMMUNITY)
Admission: EM | Admit: 2020-10-10 | Discharge: 2020-10-10 | Discharge status: Against Medical Advice | Payer: BC Managed Care – PPO

## 2020-10-10 ENCOUNTER — Other Ambulatory Visit: Payer: Self-pay | Admitting: Family Medicine

## 2020-10-10 VITALS — BP 110/60 | HR 90 | Ht 63.0 in | Wt 156.4 lb

## 2020-10-10 DIAGNOSIS — M25562 Pain in left knee: Secondary | ICD-10-CM

## 2020-10-10 DIAGNOSIS — Z3041 Encounter for surveillance of contraceptive pills: Secondary | ICD-10-CM

## 2020-10-10 DIAGNOSIS — M62830 Muscle spasm of back: Secondary | ICD-10-CM

## 2020-10-10 MED ORDER — CYCLOBENZAPRINE HCL 5 MG PO TABS: 5.0000 mg | ORAL_TABLET | Freq: Every evening | ORAL | 0 refills | Status: DC | PRN

## 2020-10-10 MED ORDER — NORGESTIM-ETH ESTRAD TRIPHASIC 0.18/0.215/0.25 MG-25 MCG PO TABS: 1.0000 | ORAL_TABLET | Freq: Every day | ORAL | 3 refills | Status: DC

## 2020-10-10 MED ORDER — MELOXICAM 7.5 MG PO TABS: 7.5000 mg | ORAL_TABLET | Freq: Every day | ORAL | 0 refills | Status: DC

## 2020-10-10 NOTE — Progress Notes (Signed)
SUBJECTIVE:   CHIEF COMPLAINT / HPI:   "Pulled muscle in back": at work she was pulling boxes (works at [employer]). Since Monday night she has had an ache from the middle of her back all the way around to her Right side. Manager told her to leave and be seen. It is described as a sharp/achy pain, but more so achy. She has tried icing it, took Aleve last night, not much relief.   Left knee pain: Nov 29th or so her knee started hurting, she can't identify any particular inciting event. She went to Urgent Care December 1st, her knee had gotten swollen and was hurting really badly. Since then have tired knee brace, Meloxicam, rest, ice, soaking it in a bath. She reports it is the same today as it was Dec 1st. Xray on Dec 1st without evidence of acute process, fracture. Has never injured knee in the past. 9.5/10 is worst pain. Was recommended to get Sports Medicine referral.   PERTINENT  PMH / PSH:  Patient Active Problem List   Diagnosis Date Noted  . Spasm of back muscles 10/10/2020  . Grade II hemorrhoids 08/07/2020  . Nexplanon removal 07/25/2020  . Encounter for surveillance of contraceptive pills 07/25/2020  . Hemorrhoids 06/17/2020  . Irregular uterine bleeding 05/08/2020  . Night sweats 05/08/2020  . Nerve pain 05/08/2020  . Routine general medical examination at a health care facility 03/13/2020  . Placental abruption 12/15/2019  . Short interval between pregnancies affecting pregnancy in third trimester, antepartum 11/30/2019  . Anemia during pregnancy 07/07/2019  . Normal labor 01/22/2019  . VBAC (vaginal birth after Cesarean) 01/22/2019  . Previous cesarean delivery affecting pregnancy, antepartum 12/12/2018  . History of sexually transmitted disease 12/24/2017  . Supervision of low-risk pregnancy 08/07/2014  . Acute pain of left knee 01/08/2014    OBJECTIVE:   BP 110/60 (BP Location: Right Arm, Patient Position: Sitting, Cuff Size: Normal)   Pulse 90   Ht 5\' 3"   (1.6 m)   Wt 156 lb 6.4 oz (70.9 kg)   LMP 09/12/2020 (Exact Date)   SpO2 98%   BMI 27.71 kg/m    Physical exam: General: Well-appearing patient, no apparent distress Respiratory: Work of breathing, speaking complete sentences Spine: Normal upon inspection, no gross deformity, no ecchymosis; hypertonicity appreciated to right paravertebral spinal musculature, rhomboids, and aspects of latissimus dorsi, tenderness to palpation is appreciated on physical exam Left knee: - Inspection: no gross deformity.  Mild swelling/effusion, however no erythema or bruising. Skin intact - Palpation: TTP appreciated to medial joint line - ROM: full active ROM with flexion and extension in knee and hip; flexion of left knee produces "pop" - Strength: 5/5 strength - Neuro/vasc: NV intact - Special Tests: - LIGAMENTS: negative anterior and posterior drawer, no MCL or LCL laxity  -- MENISCUS: Pain with McMurray's  ASSESSMENT/PLAN:   Acute pain of left knee Patient with mild effusion, continued medial pain. -Meloxicam 7.5 mg 1 tablet daily for the next 5 days -Continue conservative measures with rest, ice, compression, and elevation when rested -Referral placed to sports medicine for further evaluation with ultrasound, patient may need MRI -Patient given note to go back to work but requested light duty  Spasm of back muscles Patient presenting with acute pain and spasm of her back muscles surrounding her right shoulder and right upper back.  This occurred while pushing and pulling boxes at work, patient works at 09/14/2020. -Flexeril 5 mg at night as needed -Meloxicam  7.5 mg for knee and back pain -Recommended warm compresses/warm showers     Dollene Cleveland, DO [facility]

## 2020-10-10 NOTE — Patient Instructions (Addendum)
Thank you for coming in to see Linda Keith today! Please see below to review our plan for today's visit:  1. Take Meloxicam 7.5mg  1 tablet once daily for the next 5 days scheduled. Take Flexeril 1 tablet at night before bedtime. This medication can make you really sleepy. 2. Continue to keep heat on the shoulder to help the muscles relax.  3. For the knee, continue to use either hot or cold compresses, wear your brace, rest it when you can and keep it elevated. Referral has been made to Sports Medicine.   Please call the clinic at [phone] if your symptoms worsen or you have any concerns. It was our pleasure to serve you!   Dr. Peggyann Shoals [facility]

## 2020-10-10 NOTE — Assessment & Plan Note (Signed)
Patient presenting with acute pain and spasm of her back muscles surrounding her right shoulder and right upper back.  This occurred while pushing and pulling boxes at work, patient works at [employer]. -Flexeril 5 mg at night as needed -Meloxicam 7.5 mg for knee and back pain -Recommended warm compresses/warm showers

## 2020-10-10 NOTE — Assessment & Plan Note (Addendum)
Patient with mild effusion, continued medial pain. -Meloxicam 7.5 mg 1 tablet daily for the next 5 days -Continue conservative measures with rest, ice, compression, and elevation when rested -Referral placed to sports medicine for further evaluation with ultrasound, patient may need MRI -Patient given note to go back to work but requested light duty

## 2020-10-15 ENCOUNTER — Telehealth: Payer: Self-pay | Admitting: Family Medicine

## 2020-10-15 NOTE — Telephone Encounter (Signed)
Patient is calling and would like to know if Dr. Dareen Piano could rewrite the letter for her employer. She said the letter needs to specify that she was seen in our office for bak spasms and that she is able to return to work.   Please send letter to patient on mychart.

## 2020-10-16 ENCOUNTER — Ambulatory Visit: Payer: BC Managed Care – PPO | Admitting: Family Medicine

## 2020-10-17 NOTE — Telephone Encounter (Signed)
LVM for pt that her work note has been sent to her [facility] and place at the front desk and is ready for pick up if she needs. Aquilla Solian, CMA

## 2020-10-24 ENCOUNTER — Ambulatory Visit (HOSPITAL_COMMUNITY)
Admission: EM | Admit: 2020-10-24 | Discharge: 2020-10-24 | Disposition: A | Discharge status: Home / Self Care | Payer: BC Managed Care – PPO | Attending: Urgent Care | Admitting: Urgent Care

## 2020-10-24 ENCOUNTER — Other Ambulatory Visit: Payer: Self-pay

## 2020-10-24 ENCOUNTER — Encounter (HOSPITAL_COMMUNITY): Payer: Self-pay | Admitting: Emergency Medicine

## 2020-10-24 DIAGNOSIS — B349 Viral infection, unspecified: Secondary | ICD-10-CM | POA: Diagnosis not present

## 2020-10-24 DIAGNOSIS — Z791 Long term (current) use of non-steroidal anti-inflammatories (NSAID): Secondary | ICD-10-CM | POA: Insufficient documentation

## 2020-10-24 DIAGNOSIS — R059 Cough, unspecified: Secondary | ICD-10-CM

## 2020-10-24 DIAGNOSIS — Z793 Long term (current) use of hormonal contraceptives: Secondary | ICD-10-CM | POA: Diagnosis not present

## 2020-10-24 DIAGNOSIS — Z79899 Other long term (current) drug therapy: Secondary | ICD-10-CM | POA: Insufficient documentation

## 2020-10-24 DIAGNOSIS — R195 Other fecal abnormalities: Secondary | ICD-10-CM

## 2020-10-24 DIAGNOSIS — R110 Nausea: Secondary | ICD-10-CM

## 2020-10-24 DIAGNOSIS — Z283 Underimmunization status: Secondary | ICD-10-CM | POA: Insufficient documentation

## 2020-10-24 DIAGNOSIS — R051 Acute cough: Secondary | ICD-10-CM | POA: Diagnosis present

## 2020-10-24 DIAGNOSIS — U071 COVID-19: Secondary | ICD-10-CM | POA: Insufficient documentation

## 2020-10-24 LAB — SARS CORONAVIRUS 2 (TAT 6-24 HRS): SARS Coronavirus 2: POSITIVE — AB

## 2020-10-24 MED ORDER — PROMETHAZINE-DM 6.25-15 MG/5ML PO SYRP: 5.0000 mL | ORAL_SOLUTION | Freq: Every evening | ORAL | 0 refills | Status: DC | PRN

## 2020-10-24 MED ORDER — ONDANSETRON 8 MG PO TBDP: 8.0000 mg | ORAL_TABLET | Freq: Three times a day (TID) | ORAL | 0 refills | Status: DC | PRN

## 2020-10-24 MED ORDER — LOPERAMIDE HCL 2 MG PO CAPS: 2.0000 mg | ORAL_CAPSULE | Freq: Two times a day (BID) | ORAL | 0 refills | Status: DC | PRN

## 2020-10-24 MED ORDER — PSEUDOEPHEDRINE HCL 60 MG PO TABS: 60.0000 mg | ORAL_TABLET | Freq: Three times a day (TID) | ORAL | 0 refills | Status: DC | PRN

## 2020-10-24 MED ORDER — CETIRIZINE HCL 10 MG PO TABS: 10.0000 mg | ORAL_TABLET | Freq: Every day | ORAL | 0 refills | Status: DC

## 2020-10-24 MED ORDER — BENZONATATE 100 MG PO CAPS
100.0000 mg | ORAL_CAPSULE | Freq: Three times a day (TID) | ORAL | 0 refills | Status: DC | PRN
Start: 2020-10-24 — End: 2020-12-27

## 2020-10-24 NOTE — ED Triage Notes (Signed)
PT C/O: here for COVID testing.... started having sx yest morning... Sx include: chills, headache 10/10, body aches, cough, fevers  Has not had flu or COVID shots  DENIES: v/n/d  TAKING MEDS: OTC Aleve  A&O x4... NAD... Ambulatory

## 2020-10-24 NOTE — ED Provider Notes (Signed)
[facility]   MRN: [id number] DOB: 02-08-1996  Subjective:   Linda Keith is a 24 y.o. female presenting for 1 day history of acute onset chills, headache, body aches, coughing, fevers.  Patient is not vaccinated against the flu or COVID-19.  Denies chest pain, shortness of breath.  She has used over-the-counter Aleve with some relief.  Denies history of asthma, smoking history.  No current facility-administered medications for this encounter.  Current Outpatient Medications:  .  benzonatate (TESSALON) 100 MG capsule, Take 1-2 capsules (100-200 mg total) by mouth 3 (three) times daily as needed for cough., Disp: 60 capsule, Rfl: 0 .  cetirizine (ZYRTEC ALLERGY) 10 MG tablet, Take 1 tablet (10 mg total) by mouth daily., Disp: 30 tablet, Rfl: 0 .  cyclobenzaprine (FLEXERIL) 5 MG tablet, Take 1 tablet (5 mg total) by mouth at bedtime as needed for muscle spasms., Disp: 10 tablet, Rfl: 0 .  Ferrous Sulfate (IRON) 325 (65 Fe) MG TABS, Take 1 tablet (325 mg total) by mouth daily., Disp: 30 tablet, Rfl: 0 .  hydrocortisone (ANUSOL-HC) 25 MG suppository, Place 1 suppository (25 mg total) rectally 2 (two) times daily., Disp: 24 suppository, Rfl: 3 .  Hydrocortisone Acetate 1 % OINT, Apply 1 g topically in the morning and at bedtime., Disp: 28.4 g, Rfl: 3 .  meloxicam (MOBIC) 7.5 MG tablet, Take 1 tablet (7.5 mg total) by mouth daily., Disp: 10 tablet, Rfl: 0 .  Norgestimate-Ethinyl Estradiol Triphasic (ORTHO TRI-CYCLEN LO) 0.18/0.215/0.25 MG-25 MCG tab, Take 1 tablet by mouth daily., Disp: 84 tablet, Rfl: 3 .  polyethylene glycol powder (GLYCOLAX/MIRALAX) 17 GM/SCOOP powder, Take 17 g by mouth 2 (two) times daily as needed for moderate constipation., Disp: 3350 g, Rfl: 1 .  Prenatal Vit-Fe Fumarate-FA (PRENATAL 19) tablet, Chew 1 tablet by mouth daily., Disp: 30 tablet, Rfl: 11 .  promethazine-dextromethorphan (PROMETHAZINE-DM) 6.25-15 MG/5ML syrup, Take 5 mLs by mouth at  bedtime as needed for cough., Disp: 100 mL, Rfl: 0 .  pseudoephedrine (SUDAFED) 30 MG tablet, Take 1 tablet (30 mg total) by mouth every 8 (eight) hours as needed for congestion., Disp: 30 tablet, Rfl: 0 .  sertraline (ZOLOFT) 50 MG tablet, Take 1 tablet (50 mg total) by mouth daily. (Patient not taking: Reported on 11/20/2019), Disp: 30 tablet, Rfl: 3   No Known Allergies  Past Medical History:  Diagnosis Date  . Anemia   . Depression    had pp depression which improved without meds  . Prenatal care 08/07/2014    Nursing Staff Provider Office Location  [facility] Dating  LMP:05/06/18 Language  English Anatomy US   normal Flu Vaccine  08/02/18 Genetic Screen  Quad: WNL  TDaP vaccine    Hgb A1C or  GTT Early  Third trimester 112 Rhogam  NA   LAB RESULTS  Feeding Plan  Blood Type O/Positive/-- (09/04 1459) O+ Contraception  Antibody Negative (09/04 1459)Negative Circumcision  yes Rubella 4.26 (09/04 1459) 4.26 immune     Past Surgical History:  Procedure Laterality Date  . CESAREAN SECTION N/A 11/14/2014   Procedure: CESAREAN SECTION;  Surgeon: Catalina Antigua, MD;  Location: [facility];  Service: Obstetrics;  Laterality: N/A;    Family History  Problem Relation Age of Onset  . Cardiomyopathy Mother   . Peripheral Artery Disease Mother     Social History   Tobacco Use  . Smoking status: Never Smoker  . Smokeless tobacco: Never Used  Vaping Use  . Vaping Use:  Never used  Substance Use Topics  . Alcohol use: Yes    Alcohol/week: 2.0 standard drinks    Types: 2 Glasses of wine per week  . Drug use: No    ROS   Objective:   Vitals: BP 129/85 (BP Location: Right Arm)   Pulse 82   Temp 98.8 F (37.1 C) (Oral)   Resp 16   LMP 10/17/2020   SpO2 100%   Breastfeeding No   Physical Exam Constitutional:      General: She is not in acute distress.    Appearance: Normal appearance. She is well-developed. She is not ill-appearing, toxic-appearing or diaphoretic.  HENT:     Head:  Normocephalic and atraumatic.     Nose: Nose normal.     Mouth/Throat:     Mouth: Mucous membranes are moist.  Eyes:     Extraocular Movements: Extraocular movements intact.     Pupils: Pupils are equal, round, and reactive to light.  Cardiovascular:     Rate and Rhythm: Normal rate and regular rhythm.     Pulses: Normal pulses.     Heart sounds: Normal heart sounds. No murmur heard. No friction rub. No gallop.   Pulmonary:     Effort: Pulmonary effort is normal. No respiratory distress.     Breath sounds: Normal breath sounds. No stridor. No wheezing, rhonchi or rales.  Skin:    General: Skin is warm and dry.     Findings: No rash.  Neurological:     Mental Status: She is alert and oriented to person, place, and time.  Psychiatric:        Mood and Affect: Mood normal.        Behavior: Behavior normal.        Thought Content: Thought content normal.     Assessment and Plan :   PDMP not reviewed this encounter.  1. Viral syndrome   2. Cough   3. Nausea without vomiting   4. Loose stools     Will manage for viral illness such as viral URI, viral syndrome, viral rhinitis, COVID-19. Counseled patient on nature of COVID-19 including modes of transmission, diagnostic testing, management and supportive care.  Offered scripts for symptomatic relief. COVID 19 testing is pending. Counseled patient on potential for adverse effects with medications prescribed/recommended today, ER and return-to-clinic precautions discussed, patient verbalized understanding.     Wallis Bamberg, PA-C 10/24/20 1146

## 2020-10-24 NOTE — Discharge Instructions (Addendum)
We will notify you of your flu and COVID-19 test results as they arrive and may take between 24 to 48 hours.  I encourage you to sign up for MyChart if you have not already done so as this can be the easiest way for [place] to communicate results to you online or through a phone app.  In the meantime, if you develop worsening symptoms including fever, chest pain, shortness of breath despite our current treatment plan then please report to the emergency room as this may be a sign of worsening status from possible COVID-19 infection.  Otherwise, we will manage this as a viral syndrome. For sore throat or cough try using a honey-based tea. Use 3 teaspoons of honey with juice squeezed from half lemon. Place shaved pieces of ginger into 1/2-1 cup of water and warm over stove top. Then mix the ingredients and repeat every 4 hours as needed. Please take Tylenol 500mg -650mg  every 6 hours for aches and pains, fevers. Hydrate very well with at least 2 liters of water. Eat light meals such as soups to replenish electrolytes and soft fruits, veggies. Start an antihistamine like Zyrtec, Allegra or Claritin for postnasal drainage, sinus congestion.  You can take this together with pseudoephedrine (Sudafed) at a dose of 60 mg 2-3 times a day as needed for the same kind of congestion.

## 2020-10-31 ENCOUNTER — Ambulatory Visit: Payer: BC Managed Care – PPO | Admitting: Family Medicine

## 2020-12-27 ENCOUNTER — Other Ambulatory Visit: Payer: Self-pay

## 2020-12-27 ENCOUNTER — Ambulatory Visit (HOSPITAL_COMMUNITY)
Admission: RE | Admit: 2020-12-27 | Discharge: 2020-12-27 | Disposition: A | Discharge status: Home / Self Care | Payer: Medicaid Other | Source: Ambulatory Visit | Attending: Medical Oncology | Admitting: Medical Oncology

## 2020-12-27 ENCOUNTER — Encounter (HOSPITAL_COMMUNITY): Payer: Self-pay

## 2020-12-27 VITALS — BP 121/96 | HR 90 | Temp 98.4°F | Resp 18

## 2020-12-27 DIAGNOSIS — Z20822 Contact with and (suspected) exposure to covid-19: Secondary | ICD-10-CM | POA: Diagnosis not present

## 2020-12-27 DIAGNOSIS — R059 Cough, unspecified: Secondary | ICD-10-CM | POA: Insufficient documentation

## 2020-12-27 DIAGNOSIS — R0989 Other specified symptoms and signs involving the circulatory and respiratory systems: Secondary | ICD-10-CM

## 2020-12-27 DIAGNOSIS — R0981 Nasal congestion: Secondary | ICD-10-CM

## 2020-12-27 MED ORDER — BENZONATATE 100 MG PO CAPS: 100.0000 mg | ORAL_CAPSULE | Freq: Three times a day (TID) | ORAL | 0 refills | Status: DC

## 2020-12-27 MED ORDER — FLUTICASONE PROPIONATE 50 MCG/ACT NA SUSP: 2.0000 | Freq: Every day | NASAL | 2 refills | Status: DC

## 2020-12-27 NOTE — ED Provider Notes (Signed)
[facility]    CSN: [id number] Arrival date & time: 12/27/20  1546      History   Chief Complaint Chief Complaint  Patient presents with  . Cough  . Nasal Congestion    HPI Linda Keith is a 25 y.o. female.   HPI  Cold Symptoms: Pt states that for the past 2 days she has had sneezing, cough. Failing to improve. She denies fever, nausea, vomiting, SOB, chest pains. She has taken Nyquil at home. Nyquil helped some. Son was sick with an unknown cold last week.     Past Medical History:  Diagnosis Date  . Anemia   . Depression    had pp depression which improved without meds  . Prenatal care 08/07/2014    Nursing Staff Provider Office Location  [facility] Dating  LMP:05/06/18 Language  English Anatomy US   normal Flu Vaccine  08/02/18 Genetic Screen  Quad: WNL  TDaP vaccine    Hgb A1C or  GTT Early  Third trimester 112 Rhogam  NA   LAB RESULTS  Feeding Plan  Blood Type O/Positive/-- (09/04 1459) O+ Contraception  Antibody Negative (09/04 1459)Negative Circumcision  yes Rubella 4.26 (09/04 1459) 4.26 immune    Patient Active Problem List   Diagnosis Date Noted  . Spasm of back muscles 10/10/2020  . Grade II hemorrhoids 08/07/2020  . Nexplanon removal 07/25/2020  . Encounter for surveillance of contraceptive pills 07/25/2020  . Hemorrhoids 06/17/2020  . Irregular uterine bleeding 05/08/2020  . Night sweats 05/08/2020  . Nerve pain 05/08/2020  . Routine general medical examination at a health care facility 03/13/2020  . Placental abruption 12/15/2019  . Short interval between pregnancies affecting pregnancy in third trimester, antepartum 11/30/2019  . Anemia during pregnancy 07/07/2019  . Normal labor 01/22/2019  . VBAC (vaginal birth after Cesarean) 01/22/2019  . Previous cesarean delivery affecting pregnancy, antepartum 12/12/2018  . History of sexually transmitted disease 12/24/2017  . Supervision of low-risk pregnancy 08/07/2014  . Acute pain of left knee  01/08/2014    Past Surgical History:  Procedure Laterality Date  . CESAREAN SECTION N/A 11/14/2014   Procedure: CESAREAN SECTION;  Surgeon: Catalina Antigua, MD;  Location: [facility];  Service: Obstetrics;  Laterality: N/A;    OB History    Gravida  3   Para  3   Term  3   Preterm      AB      Living  3     SAB      IAB      Ectopic      Multiple  0   Live Births  3            Home Medications    Prior to Admission medications   Medication Sig Start Date End Date Taking? Authorizing Provider  benzonatate (TESSALON) 100 MG capsule Take 1 capsule (100 mg total) by mouth every 8 (eight) hours. 12/27/20  Yes Amado Andal M, PA-C  fluticasone [facility]) 50 MCG/ACT nasal spray Place 2 sprays into both nostrils daily. 12/27/20  Yes Avilene Marrin M, PA-C  cetirizine (ZYRTEC ALLERGY) 10 MG tablet Take 1 tablet (10 mg total) by mouth daily. 10/24/20   Wallis Bamberg, PA-C  cyclobenzaprine (FLEXERIL) 5 MG tablet Take 1 tablet (5 mg total) by mouth at bedtime as needed for muscle spasms. 10/10/20   Dollene Cleveland, DO  Ferrous Sulfate (IRON) 325 (65 Fe) MG TABS Take 1 tablet (325 mg total) by mouth daily. 08/07/20  Dollene Cleveland, DO  hydrocortisone (ANUSOL-HC) 25 MG suppository Place 1 suppository (25 mg total) rectally 2 (two) times daily. 08/07/20   Dollene Cleveland, DO  Hydrocortisone Acetate 1 % OINT Apply 1 g topically in the morning and at bedtime. 08/07/20   Dollene Cleveland, DO  loperamide (IMODIUM) 2 MG capsule Take 1 capsule (2 mg total) by mouth 2 (two) times daily as needed for diarrhea or loose stools. 10/24/20   Wallis Bamberg, PA-C  meloxicam (MOBIC) 7.5 MG tablet Take 1 tablet (7.5 mg total) by mouth daily. 10/10/20   Dollene Cleveland, DO  Norgestimate-Ethinyl Estradiol Triphasic (ORTHO TRI-CYCLEN LO) 0.18/0.215/0.25 MG-25 MCG tab Take 1 tablet by mouth daily. 10/10/20   Peggyann Shoals C, DO  ondansetron (ZOFRAN-ODT) 8 MG disintegrating tablet Take 1  tablet (8 mg total) by mouth every 8 (eight) hours as needed for nausea or vomiting. 10/24/20   Wallis Bamberg, PA-C  polyethylene glycol powder (GLYCOLAX/MIRALAX) 17 GM/SCOOP powder Take 17 g by mouth 2 (two) times daily as needed for moderate constipation. 11/30/19   Westley Chandler, MD  Prenatal Vit-Fe Fumarate-FA (PRENATAL 19) tablet Chew 1 tablet by mouth daily. 05/04/19   Donette Larry, CNM  promethazine-dextromethorphan (PROMETHAZINE-DM) 6.25-15 MG/5ML syrup Take 5 mLs by mouth at bedtime as needed for cough. 10/24/20   Wallis Bamberg, PA-C  pseudoephedrine (SUDAFED) 60 MG tablet Take 1 tablet (60 mg total) by mouth every 8 (eight) hours as needed for congestion. 10/24/20   Wallis Bamberg, PA-C  sertraline (ZOLOFT) 50 MG tablet Take 1 tablet (50 mg total) by mouth daily. Patient not taking: Reported on 11/20/2019 04/26/19   Garnette Gunner, MD    Family History Family History  Problem Relation Age of Onset  . Cardiomyopathy Mother   . Peripheral Artery Disease Mother     Social History Social History   Tobacco Use  . Smoking status: Never Smoker  . Smokeless tobacco: Never Used  Vaping Use  . Vaping Use: Never used  Substance Use Topics  . Alcohol use: Yes    Alcohol/week: 2.0 standard drinks    Types: 2 Glasses of wine per week  . Drug use: No     Allergies   Patient has no known allergies.   Review of Systems Review of Systems  As stated above in HPI  Physical Exam Triage Vital Signs ED Triage Vitals  Enc Vitals Group     BP 12/27/20 1601 (!) 121/96     Pulse Rate 12/27/20 1601 90     Resp 12/27/20 1601 18     Temp 12/27/20 1601 98.4 F (36.9 C)     Temp Source 12/27/20 1601 Oral     SpO2 12/27/20 1601 100 %     Weight --      Height --      Head Circumference --      Peak Flow --      Pain Score 12/27/20 1557 0     Pain Loc --      Pain Edu? --      Excl. in GC? --    No data found.  Updated Vital Signs BP (!) 121/96 (BP Location: Right Arm)   Pulse 90    Temp 98.4 F (36.9 C) (Oral)   Resp 18   LMP 12/13/2020 (Approximate)   SpO2 100%   Physical Exam Vitals and nursing note reviewed.  Constitutional:      General: She is not in acute distress.  Appearance: Normal appearance. She is not ill-appearing, toxic-appearing or diaphoretic.     Comments: Pt sounds congested in her nose  HENT:     Head: Normocephalic and atraumatic.     Right Ear: There is impacted cerumen.     Left Ear: There is impacted cerumen.     Nose: Congestion and rhinorrhea present.     Comments: Nasal erythema without sinus bogginess    Mouth/Throat:     Mouth: Mucous membranes are moist.     Pharynx: Oropharynx is clear. Posterior oropharyngeal erythema (scant erythema) present. No oropharyngeal exudate.  Eyes:     Extraocular Movements: Extraocular movements intact.     Pupils: Pupils are equal, round, and reactive to light.  Cardiovascular:     Rate and Rhythm: Normal rate and regular rhythm.     Heart sounds: Normal heart sounds.  Pulmonary:     Effort: Pulmonary effort is normal.     Breath sounds: Normal breath sounds.  Musculoskeletal:     Cervical back: Normal range of motion and neck supple.  Lymphadenopathy:     Cervical: No cervical adenopathy.  Neurological:     Mental Status: She is alert.      UC Treatments / Results  Labs (all labs ordered are listed, but only abnormal results are displayed) Labs Reviewed  SARS CORONAVIRUS 2 (TAT 6-24 HRS)    EKG   Radiology No results found.  Procedures Procedures (including critical care time)  Medications Ordered in UC Medications - No data to display  Initial Impression / Assessment and Plan / UC Course  I have reviewed the triage vital signs and the nursing notes.  Pertinent labs & imaging results that were available during my care of the patient were reviewed by me and considered in my medical decision making (see chart for details).     New. Testing for COVID-19 though less  likely given timeframe and sending her home with flonose to help with her nasal congestion.  Final Clinical Impressions(s) / UC Diagnoses   Final diagnoses:  Nasal congestion  Runny nose  Cough   Discharge Instructions   None    ED Prescriptions    Medication Sig Dispense Auth. Provider   benzonatate (TESSALON) 100 MG capsule Take 1 capsule (100 mg total) by mouth every 8 (eight) hours. 21 capsule Jamariyah Johannsen M, PA-C   fluticasone [facility]) 50 MCG/ACT nasal spray Place 2 sprays into both nostrils daily. 16 g Cherish Runde M, [place]     PDMP not reviewed this encounter.   Rushie Chestnut, [place] 12/27/20 1638

## 2020-12-27 NOTE — ED Triage Notes (Signed)
Pt presents with cough and sneezing x 2 days. She denies fever, nausea and vomiting. She states she took Nyquil at home.

## 2020-12-28 LAB — SARS CORONAVIRUS 2 (TAT 6-24 HRS): SARS Coronavirus 2: NEGATIVE

## 2021-02-17 ENCOUNTER — Ambulatory Visit (INDEPENDENT_AMBULATORY_CARE_PROVIDER_SITE_OTHER): Payer: Medicaid Other | Admitting: Family Medicine

## 2021-02-17 ENCOUNTER — Other Ambulatory Visit (HOSPITAL_COMMUNITY)
Admission: RE | Admit: 2021-02-17 | Discharge: 2021-02-17 | Disposition: A | Discharge status: Home / Self Care | Payer: Medicaid Other | Source: Ambulatory Visit | Attending: Family Medicine | Admitting: Family Medicine

## 2021-02-17 ENCOUNTER — Other Ambulatory Visit: Payer: Self-pay

## 2021-02-17 VITALS — BP 98/72 | HR 84 | Ht 63.0 in | Wt 147.0 lb

## 2021-02-17 DIAGNOSIS — Z308 Encounter for other contraceptive management: Secondary | ICD-10-CM

## 2021-02-17 DIAGNOSIS — Z124 Encounter for screening for malignant neoplasm of cervix: Secondary | ICD-10-CM | POA: Insufficient documentation

## 2021-02-17 DIAGNOSIS — Z113 Encounter for screening for infections with a predominantly sexual mode of transmission: Secondary | ICD-10-CM | POA: Diagnosis present

## 2021-02-17 DIAGNOSIS — Z3041 Encounter for surveillance of contraceptive pills: Secondary | ICD-10-CM

## 2021-02-17 LAB — POCT WET PREP (WET MOUNT)
Clue Cells Wet Prep Whiff POC: POSITIVE
Trichomonas Wet Prep HPF POC: ABSENT

## 2021-02-17 LAB — POCT URINE PREGNANCY: Preg Test, Ur: NEGATIVE

## 2021-02-17 MED ORDER — FOLIC ACID 1 MG PO TABS: 1.0000 mg | ORAL_TABLET | Freq: Every day | ORAL | 3 refills | Status: AC

## 2021-02-17 MED ORDER — FLUTICASONE PROPIONATE 50 MCG/ACT NA SUSP: 2.0000 | Freq: Every day | NASAL | 2 refills | Status: AC

## 2021-02-17 MED ORDER — CETIRIZINE HCL 10 MG PO TABS: 10.0000 mg | ORAL_TABLET | Freq: Every day | ORAL | 0 refills | Status: AC

## 2021-02-17 MED ORDER — NORGESTIM-ETH ESTRAD TRIPHASIC 0.18/0.215/0.25 MG-25 MCG PO TABS: 1.0000 | ORAL_TABLET | Freq: Every day | ORAL | 3 refills | Status: DC

## 2021-02-17 NOTE — Assessment & Plan Note (Addendum)
Last PAP 2019 wnl.  Normal vaginal exam -UPT negative -Wet mount -GC/G -HIV, Hep C and RPR -Cytology sent -Follow up with results

## 2021-02-17 NOTE — Assessment & Plan Note (Addendum)
Discussed BCP and indications.  No contraindications.  Tolerated previous BCP and would like to restart -UPT negative -Restart previous BCP -Folic acid daily -Follow up with PCP as needed

## 2021-02-17 NOTE — Patient Instructions (Addendum)
Thank you for coming to see me today. It was a pleasure.   Will call you with the results of your PAP and other tests  We will get some labs today.  If they are abnormal or we need to do something about them, I will call you.  If they are normal, I will send you a message on MyChart (if it is active) or a letter in the mail.  If you don't hear from [place] in 2 weeks, please call the office at the number below.   Please follow-up with PCP as needed  If you have any questions or concerns, please do not hesitate to call the office at [phone].  Best,   Dana Allan, MD    Oral Contraception Information Oral contraceptive pills (OCPs) are medicines taken by mouth to prevent pregnancy. They work by:  Preventing the ovaries from releasing eggs.  Thickening mucus in the lower part of the uterus (cervix). This prevents sperm from entering the uterus.  Thinning the lining of the uterus (endometrium). This prevents a fertilized egg from attaching to the endometrium. OCPs are highly effective when taken exactly as prescribed. However, OCPs do not prevent STIs (sexually transmitted infections). Using condoms while on an OCP can help prevent STIs. What happens before starting OCPs? Before you start taking OCPs:  You may have a physical exam, blood test, and Pap test.  Your health care provider will make sure you are a good candidate for oral contraception. OCPs are not a good option for certain women, such as: ? Women who smoke and are older than age 104. ? Women who have or have had certain conditions, such as:  A history of high blood pressure.  Deep vein thrombosis.  Pulmonary embolism.  Stroke.  Cardiovascular disease.  Peripheral vascular disease. Ask your health care provider about the possible side effects of the OCP you may be prescribed. Be aware that it can take 2-3 months for your body to adjust to changes in hormone levels. Types of oral contraception Birth control pills  contain the hormones estrogen and progestin (synthetic progesterone) or progestin only. The combination pill This type of pill contains estrogen and progestin hormones.  Conventional contraception pills come in packs of 21 or 28 pills. ? Some packs with 28-day pills contain estrogen and progestin for the first 21-24 days. Hormone-free tablets, called placebos, are taken for the final 4-7 days. You should have menstrual bleeding during the time you take the placebos. ? In packs with 21 tablets, you take no pills for 7 days. Menstrual bleeding occurs during these days. (Some people prefer taking a pill for 28 days to help establish a routine).  Extended-interval contraception pills come in packs of 91 pills. The first 84 tablets have both estrogen and progestin. The last 7 pills are placebos. Menstrual bleeding occurs during the placebo days. With this schedule, menstrual bleeding happens once every 3 months.  Continuous contraception pills come in packs of 28 pills. All pills in the pack contain estrogen and progestin. With this schedule, regular menstrual bleeding does not happen, but there may be spotting or irregular bleeding. Progestin-only pills This type of pill is often called the mini-pill and contains the progestin hormone only. It comes in packs of 28 pills. In some packs, the last 4 pills are placebos. The pill must be taken at the same time every day. This is very important to prevent pregnancy. Menstrual bleeding may not be regular or predictable.   What are  the advantages? Oral contraception provides reliable and continuous contraception if taken as directed. It may treat or decrease symptoms of:  Menstrual period cramps.  Irregular menstrual cycle or bleeding.  Heavy menstrual flow.  Abnormal uterine bleeding.  Acne, depending on the type of pill.  Polycystic ovarian syndrome (POS).  Endometriosis.  Iron deficiency anemia.  Premenstrual symptoms, including severe  irritability, depression, or anxiety. It also may:  Reduce the risk of endometrial and ovarian cancer.  Be used as emergency contraception.  Prevent ectopic pregnancies and infections of the fallopian tubes. What can make OCPs less effective? OCPs may be less effective if:  You forget to take the pill every day. For progestin-only pills, it is especially important to take the pill at the same time each day. Even taking it 3 hours late can increase the risk of pregnancy.  You have a stomach or intestinal disease that reduces your body's ability to absorb the pill.  You take OCPs with other medicines that make OCPs less effective, such as antibiotics, certain HIV medicines, and some seizure medicines.  You take expired OCPs.  You forget to restart the pill after 7 days of not taking it. This refers to the packs of 21 pills. What are the side effects and risks? OCPs can sometimes cause side effects, such as:  Headache.  Depression.  Trouble sleeping.  Nausea and vomiting.  Breast tenderness.  Irregular bleeding or spotting during the first several months.  Bloating or fluid retention.  Increase in blood pressure. Combination pills may slightly increase the risk of:  Blood clots.  Heart attack.  Stroke. Follow these instructions at home: Follow instructions from your health care provider about how to start taking your first cycle of OCPs. Depending on when you start the pill, you may need to use a backup form of birth control, such as condoms, during the first week. Make sure you know what steps to take if you forget to take the pill. Summary  Oral contraceptive pills (OCPs) are medicines taken by mouth to prevent pregnancy. They are highly effective when taken exactly as prescribed.  OCPs contain a combination of the hormones estrogen and progestin (synthetic progesterone) or progestin only.  Before you start taking the pill, you may have a physical exam, blood test,  and Pap test. Your health care provider will make sure you are a good candidate for oral contraception.  The combination pill may come in a 21-day pack, a 28-day pack, or a 91-day pack. Progestin-only pills come in packs of 28 pills.  OCPs can sometimes cause side effects, such as headache, nausea, breast tenderness, or irregular bleeding. This information is not intended to replace advice given to you by your health care provider. Make sure you discuss any questions you have with your health care provider. Document Revised: 07/12/2020 Document Reviewed: 06/20/2020 Elsevier Patient Education  2021 Elsevier Inc.  HPV (Human Papillomavirus) Vaccine: What You Need to Know 1. Why get vaccinated? HPV (human papillomavirus) vaccine can prevent infection with some types of human papillomavirus. HPV infections can cause certain types of cancers, including:  cervical, vaginal, and vulvar cancers in women  penile cancer in men  anal cancers in both men and women  cancers of tonsils, base of tongue, and back of throat (oropharyngeal cancer) in both men and women HPV infections can also cause anogenital warts. HPV vaccine can prevent over 90% of cancers caused by HPV. HPV is spread through intimate skin-to-skin or sexual contact. HPV infections are so  common that nearly all people will get at least one type of HPV at some time in their lives. Most HPV infections go away on their own within 2 years. But sometimes HPV infections will last longer and can cause cancers later in life. 2. HPV vaccine HPV vaccine is routinely recommended for adolescents at 6111 or 24 years of age to ensure they are protected before they are exposed to the virus. HPV vaccine may be given beginning at age 619 years and vaccination is recommended for everyone through 24 years of age. HPV vaccine may be given to adults 27 through 24 years of age, based on discussions between the patient and health care provider. Most children who  get the first dose before 24 years of age need 2 doses of HPV vaccine. People who get the first dose at or after 24 years of age and younger people with certain immunocompromising conditions need 3 doses. Your health care provider can give you more information. HPV vaccine may be given at the same time as other vaccines. 3. Talk with your health care provider Tell your vaccination provider if the person getting the vaccine:  Has had an allergic reaction after a previous dose of HPV vaccine, or has any severe, life-threatening allergies  Is pregnant--HPV vaccine is not recommended until after pregnancy In some cases, your health care provider may decide to postpone HPV vaccination until a future visit. People with minor illnesses, such as a cold, may be vaccinated. People who are moderately or severely ill should usually wait until they recover before getting HPV vaccine. Your health care provider can give you more information. 4. Risks of a vaccine reaction  Soreness, redness, or swelling where the shot is given can happen after HPV vaccination.  Fever or headache can happen after HPV vaccination. People sometimes faint after medical procedures, including vaccination. Tell your provider if you feel dizzy or have vision changes or ringing in the ears. As with any medicine, there is a very remote chance of a vaccine causing a severe allergic reaction, other serious injury, or death. 5. What if there is a serious problem? An allergic reaction could occur after the vaccinated person leaves the clinic. If you see signs of a severe allergic reaction (hives, swelling of the face and throat, difficulty breathing, a fast heartbeat, dizziness, or weakness), call 9-1-1 and get the person to the nearest hospital. For other signs that concern you, call your health care provider. Adverse reactions should be reported to the Vaccine Adverse Event Reporting System (VAERS). Your health care provider will usually  file this report, or you can do it yourself. Visit the VAERS website at www.vaers.LAgents.nohhs.gov or call 269-036-68781-818-852-6955. VAERS is only for reporting reactions, and VAERS staff members do not give medical advice. 6. The National Vaccine Injury Compensation Program The [employer] Vaccine Injury Compensation Program (VICP) is a federal program that was created to compensate people who may have been injured by certain vaccines. Claims regarding alleged injury or death due to vaccination have a time limit for filing, which may be as short as two years. Visit the VICP website at SpiritualWord.atwww.hrsa.gov/vaccinecompensation or call 902-176-68791-947-099-8681 to learn about the program and about filing a claim. 7. How can I learn more?  Ask your health care provider.  Call your local or state health department.  Visit the website of the Food and Drug Administration (FDA) for vaccine package inserts and additional information at FinderList.nowww.fda.gov/vaccines-blood-biologics/vaccines.  Contact the Centers for Disease Control and Prevention (CDC): ?  Call (714)678-3462 (1-800-CDC-INFO) or ? Visit CDC's website at PicCapture.uy. Vaccine Information Statement HPV Vaccine (05/31/2020) This information is not intended to replace advice given to you by your health care provider. Make sure you discuss any questions you have with your health care provider. Document Revised: 07/09/2020 Document Reviewed: 07/09/2020 Elsevier Patient Education  2021 [employer].

## 2021-02-17 NOTE — Progress Notes (Signed)
    SUBJECTIVE:   CHIEF COMPLAINT / HPI:   Linda Keith is a 25 y.o. female here for an annual gynecological exam.   Gyn concerns/Preventative healthcare  Last menstrual period: Patient's last menstrual period was 02/14/2021 (exact date).  Regular periods:   Heavy bleeding: no  Sexually active: yes  Contraception or hormonal therapy:  Hx of STD: Patient desire STD screening  Dyspareunia: No  Vaginal discharge: no  Dysuria:No   Last pap smear: 2019, wnl   PERTINENT  PMH / PSH:  Elective Abortion 12/2020  OBJECTIVE:   BP 98/72   Pulse 84   Ht 5\' 3"  (1.6 m)   Wt 147 lb (66.7 kg)   LMP 02/14/2021 (Exact Date)   SpO2 99%   BMI 26.04 kg/m    General: Alert, no acute distress Pelvic Exam chaperoned by CMA 02/16/2021        External: normal female genitalia without lesions or masses        Vagina: normal without lesions or masses        Cervix: normal without lesions or masses, no CMT           ASSESSMENT/PLAN:   Cervical cancer screening Last PAP 2019 wnl.  Normal vaginal exam -UPT negative -Wet mount -GC/G -HIV, Hep C and RPR -Cytology sent -Follow up with results   Contraception management Discussed BCP and indications.  No contraindications.  Tolerated previous BCP and would like to restart -UPT negative -Restart previous BCP -Folic acid daily -Follow up with PCP as needed    Linda December, MD [facility]

## 2021-02-18 ENCOUNTER — Encounter: Payer: Self-pay | Admitting: Family Medicine

## 2021-02-18 LAB — CERVICOVAGINAL ANCILLARY ONLY
Chlamydia: NEGATIVE
Comment: NEGATIVE
Comment: NORMAL
Neisseria Gonorrhea: NEGATIVE

## 2021-02-19 ENCOUNTER — Encounter: Payer: Self-pay | Admitting: Family Medicine

## 2021-02-20 ENCOUNTER — Other Ambulatory Visit: Payer: Self-pay | Admitting: Family Medicine

## 2021-02-20 LAB — CYTOLOGY - PAP: Diagnosis: NEGATIVE

## 2021-02-20 MED ORDER — METRONIDAZOLE 500 MG PO TABS: 500.0000 mg | ORAL_TABLET | Freq: Two times a day (BID) | ORAL | 0 refills | Status: AC

## 2021-03-08 NOTE — Progress Notes (Deleted)
   Subjective:   Patient ID: Linda Keith    DOB: 09-19-1996, 25 y.o. female   MRN: [id number]  Linda Keith is a 25 y.o. female with a history of hemorrhoids, h/o STDs, irregular uterine bleeding, nerve pain, h/o placental abruption, h/o previous c-section and VBAC  here for left knee pain  HPI: Patient here today for pain and swelling of left knee. She was last seen on 10/10/20 for similar concerns and noted to have mild effusion. She was treated with Meloxicam 7.5mg  QD x 5 days, RICE, referred to [facility].  Appears patient has been seen for this concern multiple times since 201 and treated conservatively Has been referred to sports medicine but appears she has never been. Denies any prior injuries.    Prior Imaging: Left Knee (09/25/20) FINDINGS: No evidence of fracture, dislocation, or joint effusion. No evidence of arthropathy or other focal bone abnormality. Soft tissues are unremarkable.  IMPRESSION: No acute abnormality noted.  Review of Systems:  Per HPI.   Objective:   LMP 02/14/2021 (Exact Date)  Vitals and nursing note reviewed.  General: pleasant ***, sitting comfortably in exam chair, well nourished, well developed, in no acute distress with non-toxic appearance HEENT: normocephalic, atraumatic, moist mucous membranes, oropharynx clear without erythema or exudate, TM normal bilaterally  Neck: supple, non-tender without lymphadenopathy CV: regular rate and rhythm without murmurs, rubs, or gallops, no lower extremity edema, 2+ radial and pedal pulses bilaterally Lungs: clear to auscultation bilaterally with normal work of breathing on room air Resp: breathing comfortably on room air, speaking in full sentences Abdomen: soft, non-tender, non-distended, no masses or organomegaly palpable, normoactive bowel sounds Skin: warm, dry, no rashes or lesions Extremities: warm and well perfused, normal tone MSK: ROM grossly intact, strength intact, gait normal Neuro: Alert and  oriented, speech normal  Assessment & Plan:   No problem-specific Assessment & Plan notes found for this encounter.  No orders of the defined types were placed in this encounter.  No orders of the defined types were placed in this encounter.   {    This will disappear when note is signed, click to select method of visit    :1}  Orpah Cobb, DO PGY-3, [facility] 03/08/2021 1:14 PM

## 2021-03-10 ENCOUNTER — Ambulatory Visit: Payer: Medicaid Other | Admitting: Family Medicine

## 2021-03-13 ENCOUNTER — Encounter: Payer: Medicaid Other | Admitting: Family Medicine

## 2021-05-23 ENCOUNTER — Ambulatory Visit (HOSPITAL_COMMUNITY): Payer: Medicaid Other

## 2021-07-28 ENCOUNTER — Ambulatory Visit: Payer: Medicaid Other

## 2021-07-31 ENCOUNTER — Ambulatory Visit (INDEPENDENT_AMBULATORY_CARE_PROVIDER_SITE_OTHER): Payer: Medicaid Other | Admitting: Family Medicine

## 2021-07-31 DIAGNOSIS — Z114 Encounter for screening for human immunodeficiency virus [HIV]: Secondary | ICD-10-CM

## 2021-07-31 DIAGNOSIS — N898 Other specified noninflammatory disorders of vagina: Secondary | ICD-10-CM

## 2021-07-31 DIAGNOSIS — Z113 Encounter for screening for infections with a predominantly sexual mode of transmission: Secondary | ICD-10-CM

## 2021-07-31 DIAGNOSIS — Z3200 Encounter for pregnancy test, result unknown: Secondary | ICD-10-CM

## 2021-07-31 NOTE — Progress Notes (Signed)
No show for exam.  Shirlean Mylar, MD [facility], PGY-3

## 2021-08-18 ENCOUNTER — Ambulatory Visit (INDEPENDENT_AMBULATORY_CARE_PROVIDER_SITE_OTHER): Payer: Medicaid Other | Admitting: Family Medicine

## 2021-08-18 ENCOUNTER — Other Ambulatory Visit: Payer: Self-pay

## 2021-08-18 ENCOUNTER — Other Ambulatory Visit (HOSPITAL_COMMUNITY)
Admission: RE | Admit: 2021-08-18 | Discharge: 2021-08-18 | Disposition: A | Discharge status: Home / Self Care | Payer: Medicaid Other | Source: Ambulatory Visit | Attending: Family Medicine | Admitting: Family Medicine

## 2021-08-18 VITALS — BP 121/84 | HR 84 | Wt 156.0 lb

## 2021-08-18 DIAGNOSIS — Z113 Encounter for screening for infections with a predominantly sexual mode of transmission: Secondary | ICD-10-CM

## 2021-08-18 DIAGNOSIS — Z114 Encounter for screening for human immunodeficiency virus [HIV]: Secondary | ICD-10-CM | POA: Diagnosis not present

## 2021-08-18 DIAGNOSIS — Z1159 Encounter for screening for other viral diseases: Secondary | ICD-10-CM

## 2021-08-18 NOTE — Patient Instructions (Signed)
Preventing Sexually Transmitted Infections, Adult  Sexually transmitted infections (STIs) are diseases that are spread from person to person (are contagious). They are spread, or transmitted, through bodily fluids exchanged during sex or sexual contact. These bodily fluids include saliva, semen, blood, vaginal mucus, and urine. STIs are very common among people of all ages.  Some common STIs include:  Herpes.  Hepatitis B.  Chlamydia.  Gonorrhea.  Syphilis.  HPV (human papillomavirus).  HIV, also called the human immunodeficiency virus. This is the virus that can cause AIDS (acquired immunodeficiency syndrome).  Often, people who have these STIs do not have symptoms. Even without symptoms, these infections can be spread from person to person and require treatment.  How can these conditions affect me?  STIs can be treated, and many STIs can be cured. However, some STIs cannot be cured and will affect you for the rest of your life.  Certain STIs may:  Require you to take medicine for the rest of your life.  Affect your ability to have children (your fertility).  Increase your risk for developing another STI or certain serious health conditions. These may include:  Cervical cancer.  Head and neck cancer.  Pelvic inflammatory disease (PID), in women.  Organ damage or damage to other parts of your body, if the infection spreads.  Cause problems during pregnancy and may be transmitted to the baby during the pregnancy or childbirth.  What can increase my risk?  You may have an increased risk for developing an STI if:  You have unprotected sex. Sex includes oral, vaginal, or anal sex.  You have more than one sex partner.  You have a sex partner who has multiple sex partners.  You have sex with someone who has an STI.  You have an STI or you had an STI before.  You inject drugs or have a sex partner or partners who inject drugs.  What actions can I take to prevent STIs?  The only way to completely prevent STIs is not to have  sex of any kind. This is called practicing abstinence. If you are sexually active, you can protect yourself and others by taking these actions to lower your risk of getting an STI:  Lifestyle  Avoid mixing alcohol, drugs, and sex. Alcohol and drug use can affect your ability to make good decisions and can lead to risky sexual behaviors.  Medicines  Ask your health care provider about taking pre-exposure prophylaxis (PrEP) to prevent HIV infection.  General information    Stay up to date on vaccinations. Certain vaccines can lower your risk of getting certain STIs, such as:  Hepatitis A and hepatitis B vaccines. You may have been vaccinated as a young child, but you will likely need a booster shot as a teen or young adult.  HPV (human papillomavirus) vaccine.  Have only one sex partner (be monogamous) or limit the number of sex partners you have.  Use methods that prevent the exchange of body fluids between partners (barrier protection) correctly every time you have sex. Barrier protection can be used during oral, vaginal, or anal sex. Commonly used barrier methods include:  Female condom.  Female condom.  Dental dam.  Use a new condom for every sex act from start to finish.  Get tested for STIs. Have your partners get tested, too.  If you test positive for an STI, follow recommendations from your health care provider about treatment and make sure your sex partners are tested and treated.  Birth control   pills, injections, implants, and intrauterine devices (IUDs) do not protect against STIs. To prevent both STIs and pregnancy, always use a condom with another form of birth control.  Some STIs, such as herpes, are spread through skin-to-skin contact. A condom may not protect you from getting such STIs. Avoid all sexual contact if you or your partners have herpes and there is an active flare with open sores.  Where to find more information  Learn more about STIs from:  Centers for Disease Control and Prevention:  More  information about specific STIs: cdc.gov/std  Places to get sexual health counseling and treatment for free or at a low cost: gettested.cdc.gov  U.S. Department of Health and Human Services: www.womenshealth.gov  Summary  Sexually transmitted infections (STIs) can spread through exchanging bodily fluids during sexual contact. Fluids include saliva, semen, blood, vaginal mucus, and urine.  You may have an increased risk for developing an STI if you have unprotected sex. Sex includes oral, vaginal, or anal sex.  If you do have sex, limit your number of sex partners and use barrier protection every time you have sex.  This information is not intended to replace advice given to you by your health care provider. Make sure you discuss any questions you have with your health care provider.  Document Revised: 11/27/2019 Document Reviewed: 11/27/2019  Elsevier Patient Education  2022 Elsevier Inc.

## 2021-08-18 NOTE — Progress Notes (Signed)
    SUBJECTIVE:   CHIEF COMPLAINT / HPI:   Exposure to STD  The patient's primary symptoms include a discharge. Primary symptoms comment: Vaginal discharge with odor about 1 week ago. She had an abortion about 1 month ago and the odor started once her post abortion bleeding stopped. The problem has been unchanged. The vaginal discharge was clear (Her discharge is mostly clear with a tint of yellow). Associate symptoms include a genital odor. Pertinent negatives include no abdominal pain, fever or urinary frequency. She has tried nothing for the symptoms. Risk factors: No sexual activity since her abortion 1 month ago. She has been with same partner of 4 year and uses protection inconsistently. She wants to get STD check dor discharge.    PERTINENT  PMH / PSH: PMX reviewed  OBJECTIVE:   BP 121/84   Pulse 84   Wt 156 lb (70.8 kg)   BMI 27.63 kg/m   Physical Exam Vitals and nursing note reviewed. Exam conducted with a chaperone present Cleatrice Burke).  Cardiovascular:     Rate and Rhythm: Normal rate and regular rhythm.     Heart sounds: Normal heart sounds.  Pulmonary:     Effort: Pulmonary effort is normal.     Breath sounds: Normal breath sounds. No wheezing.  Abdominal:     General: Bowel sounds are normal. There is no distension.     Palpations: Abdomen is soft. There is no mass.     Tenderness: There is no abdominal tenderness.  Genitourinary:    Labia:        Right: No tenderness or lesion.        Left: No tenderness or lesion.      Cervix: No cervical motion tenderness or friability.     Uterus: Normal.      Adnexa: Right adnexa normal and left adnexa normal.     Comments: Brownish vaginal discharge - likely lochia    ASSESSMENT/PLAN:  Vaginal discharge with STD screen. HIV, RPR, Hep C screening completed. Vaginal/cervical specimen sent for GC/Chlam, Trichomonas, BV and yeast. I will contact her with the result Handout provided regarding STD prevention. She is  on OCP to prevent pregnancy. F/U as needed.  NB: She declined both COVID and flu shot today.  More than 50% of this 25 minutes face to face encounter was spent on record review, and coordination of care.  Janit Pagan, MD [facility]

## 2021-08-19 ENCOUNTER — Telehealth: Payer: Self-pay | Admitting: Family Medicine

## 2021-08-19 LAB — CERVICOVAGINAL ANCILLARY ONLY
Bacterial Vaginitis (gardnerella): POSITIVE — AB
Candida Glabrata: NEGATIVE
Candida Vaginitis: NEGATIVE
Chlamydia: NEGATIVE
Comment: NEGATIVE
Comment: NEGATIVE
Comment: NEGATIVE
Comment: NEGATIVE
Comment: NEGATIVE
Comment: NORMAL
Neisseria Gonorrhea: NEGATIVE
Trichomonas: NEGATIVE

## 2021-08-19 LAB — HIV ANTIBODY (ROUTINE TESTING W REFLEX): HIV Screen 4th Generation wRfx: NONREACTIVE

## 2021-08-19 LAB — HEPATITIS C ANTIBODY: Hep C Virus Ab: <0.1 s/co ratio (ref 0.0–0.9)

## 2021-08-19 LAB — RPR: RPR Ser Ql: NONREACTIVE

## 2021-08-19 MED ORDER — METRONIDAZOLE 500 MG PO TABS: 500.0000 mg | ORAL_TABLET | Freq: Two times a day (BID) | ORAL | 0 refills | Status: AC

## 2021-08-19 NOTE — Telephone Encounter (Signed)
I was unable to leave a callback message on her phone.  Please advise her that her GC/Chlamydia and Trichomonas were negative.  She does have BV and I have sent prescription to her pharmacy to treat this.  Avoid alcohol intake while on this medication. Follow-up soon if still having any concerns.

## 2021-08-19 NOTE — Telephone Encounter (Signed)
Patient returns call to nurse line. Informed of below.   Treavor Blomquist C Sigfredo Schreier, RN  

## 2021-12-09 NOTE — Progress Notes (Deleted)
° ° °  SUBJECTIVE:   CHIEF COMPLAINT / HPI:   Vaginal Discharge: Patient is a 26 y.o. female presenting with vaginal discharge for *** days.  She states the discharge is of *** consistency.  She endorses *** vaginal odor.  She is interested in screening for sexually transmitted infections today.  PERTINENT  PMH / PSH: ***None relevant  OBJECTIVE:   There were no vitals taken for this visit.   General: NAD, pleasant, able to participate in exam Respiratory: Normal effort, no obvious respiratory distress Pelvic: VULVA: normal appearing vulva with no masses, tenderness or lesions, VAGINA: Normal appearing vagina with normal color, no lesions, with {GYN VAGINAL DISCHARGE:21986} discharge present, ***CERVIX: No lesions, {GYN VAGINAL DISCHARGE:21986} discharge present,  Chaperone *** present for pelvic exam  ASSESSMENT/PLAN:   No problem-specific Assessment & Plan notes found for this encounter.    Assessment:  26 y.o. female with vaginal discharge for***days, as well as***.  Physical exam significant for*** discharge.  Wet prep performed today shows *** consistent with ***.  Patient is interested in STI screening.   Plan: -Wet prep as above.  Will treat with***. -GC/chlamydia pending -Will check HIV and RPR  Jackelyn Poling, DO [facility]

## 2021-12-10 ENCOUNTER — Ambulatory Visit: Payer: Medicaid Other | Admitting: Family Medicine

## 2021-12-11 NOTE — Progress Notes (Signed)
° ° °  SUBJECTIVE:   CHIEF COMPLAINT / HPI:   Screening for STIs: Patient is a 26 y.o. female she has no complaints.  She is interested in screening for sexually transmitted infections today.  She denies symptoms.  PERTINENT  PMH / PSH: None relevant  OBJECTIVE:   BP 118/70    Pulse 74    Wt 148 lb (67.1 kg)    LMP 12/07/2021 (Approximate)    Breastfeeding No    BMI 26.22 kg/m    General: NAD, pleasant, able to participate in exam Respiratory: Normal effort, no obvious respiratory distress Pelvic: VULVA: normal appearing vulva with no masses, tenderness or lesions, VAGINA: Normal appearing vagina with normal color, no lesions, with scant and clear discharge present,  Chaperone Jazmine present for pelvic exam  ASSESSMENT/PLAN:   Screening for STIs 26 y.o. female for routine STI screening.  She has no concerns or complaints at this time.  Denies vaginal discharge or vaginal pain.  We will screen for trichomonas via wet prep as well as gonorrhea, chlamydia, HIV, syphilis.   Jackelyn Poling, DO [facility]

## 2021-12-12 ENCOUNTER — Other Ambulatory Visit (HOSPITAL_COMMUNITY)
Admission: RE | Admit: 2021-12-12 | Discharge: 2021-12-12 | Disposition: A | Discharge status: Home / Self Care | Payer: Medicaid Other | Source: Ambulatory Visit | Attending: Family Medicine | Admitting: Family Medicine

## 2021-12-12 ENCOUNTER — Encounter: Payer: Self-pay | Admitting: Family Medicine

## 2021-12-12 ENCOUNTER — Other Ambulatory Visit: Payer: Self-pay

## 2021-12-12 ENCOUNTER — Ambulatory Visit (INDEPENDENT_AMBULATORY_CARE_PROVIDER_SITE_OTHER): Payer: Medicaid Other | Admitting: Family Medicine

## 2021-12-12 VITALS — BP 118/70 | HR 74 | Wt 148.0 lb

## 2021-12-12 DIAGNOSIS — Z113 Encounter for screening for infections with a predominantly sexual mode of transmission: Secondary | ICD-10-CM | POA: Insufficient documentation

## 2021-12-12 LAB — POCT WET PREP (WET MOUNT)
Clue Cells Wet Prep Whiff POC: NEGATIVE
Trichomonas Wet Prep HPF POC: ABSENT

## 2021-12-12 NOTE — Patient Instructions (Signed)
We are screening for sexually transmitted infections today.  I should get the results back by Monday and we will send a MyChart message if they are normal, I will call you if anything is positive and we need to treat.  If you develop any other questions or concerns do not hesitate to reach out.

## 2021-12-13 LAB — HIV ANTIBODY (ROUTINE TESTING W REFLEX): HIV Screen 4th Generation wRfx: NONREACTIVE

## 2021-12-13 LAB — RPR: RPR Ser Ql: NONREACTIVE

## 2021-12-15 LAB — CERVICOVAGINAL ANCILLARY ONLY
Chlamydia: NEGATIVE
Comment: NEGATIVE
Comment: NORMAL
Neisseria Gonorrhea: NEGATIVE

## 2021-12-19 ENCOUNTER — Encounter: Payer: Self-pay | Admitting: Family Medicine

## 2021-12-19 NOTE — Progress Notes (Signed)
Patient has no-showed to multiple appointments in a 6-month period. Per our no-show policy, a letter has been routed to [facility] Admin to be mailed to patient regarding likely dismissal for repeat no show. Will CC to PCP.  ° °

## 2022-02-04 ENCOUNTER — Ambulatory Visit: Payer: Medicaid Other

## 2022-02-10 NOTE — Progress Notes (Signed)
? ? ?  SUBJECTIVE:  ? ?CHIEF COMPLAINT / HPI: STI testing  ? ? ?26 y.o. female complains of vaginal odor.  She denies vaginal discharge. ?Denies abnormal vaginal bleeding or significant pelvic pain or ?fever. No UTI symptoms. Denies history of known exposure to STD.  She would like to have STI testing today. ? ?Patient's last menstrual period was 02/01/2022 (approximate). ? ? ?OBJECTIVE:  ? ?BP 110/80   Pulse 90   Ht 5\' 3"  (1.6 m)   Wt 147 lb 12.8 oz (67 kg)   LMP 02/01/2022 (Approximate)   SpO2 99%   BMI 26.18 kg/m?   ?Physical Exam ?Exam conducted with a chaperone present.  ?Genitourinary: ?   General: Normal vulva.  ?   Pubic Area: No rash.   ?   Tanner stage (genital): 5.  ?   Labia:     ?   Right: No rash or lesion.     ?   Left: No rash or lesion.   ?   Urethra: No urethral swelling or urethral lesion.  ?   Vagina: Normal.  ?   Cervix: Discharge present. No lesion or erythema.  ?   Uterus: Not tender.   ?   Adnexa:     ?   Right: No tenderness.      ?   Left: No tenderness.    ?   Comments: Yellow/clear discharge  ? ? ?ASSESSMENT/PLAN:  ? ?Vaginal odor ?Patient noted to have vaginal odor.  She would like to have gonorrhea and Chlamydia testing today.  She declines HIV and syphilis testing at this time.  Patient declines offer for hormonal contraception.  LMP noted for earlier this month. ?-Wet prep ?-Gonorrhea, chlamydia testing ?  ? ? ?04/03/2022, MD ?[facility]  ?

## 2022-02-11 ENCOUNTER — Ambulatory Visit (INDEPENDENT_AMBULATORY_CARE_PROVIDER_SITE_OTHER): Payer: Medicaid Other | Admitting: Family Medicine

## 2022-02-11 ENCOUNTER — Other Ambulatory Visit (HOSPITAL_COMMUNITY)
Admission: RE | Admit: 2022-02-11 | Discharge: 2022-02-11 | Disposition: A | Discharge status: Home / Self Care | Payer: Medicaid Other | Source: Ambulatory Visit | Attending: Family Medicine | Admitting: Family Medicine

## 2022-02-11 VITALS — BP 110/80 | HR 90 | Ht 63.0 in | Wt 147.8 lb

## 2022-02-11 DIAGNOSIS — N898 Other specified noninflammatory disorders of vagina: Secondary | ICD-10-CM | POA: Insufficient documentation

## 2022-02-11 LAB — POCT WET PREP (WET MOUNT)
Clue Cells Wet Prep Whiff POC: POSITIVE
Trichomonas Wet Prep HPF POC: ABSENT

## 2022-02-11 NOTE — Patient Instructions (Signed)
I will notify you of any abnormal results via telephone otherwise please look for abnormal results in MyChart. ? ?Recommend returning to care if you have worsening symptoms or begin to have bleeding or pain. ?

## 2022-02-12 ENCOUNTER — Other Ambulatory Visit: Payer: Self-pay | Admitting: Family Medicine

## 2022-02-12 LAB — CERVICOVAGINAL ANCILLARY ONLY
Chlamydia: NEGATIVE
Comment: NEGATIVE
Comment: NEGATIVE
Comment: NORMAL
Neisseria Gonorrhea: NEGATIVE
Trichomonas: NEGATIVE

## 2022-02-12 MED ORDER — METRONIDAZOLE 500 MG PO TABS: 500.0000 mg | ORAL_TABLET | Freq: Three times a day (TID) | ORAL | 0 refills | Status: DC

## 2022-02-15 DIAGNOSIS — N898 Other specified noninflammatory disorders of vagina: Secondary | ICD-10-CM | POA: Insufficient documentation

## 2022-02-15 NOTE — Assessment & Plan Note (Addendum)
Patient noted to have vaginal odor.  She would like to have gonorrhea and Chlamydia testing today.  She declines HIV and syphilis testing at this time.  Patient declines offer for hormonal contraception.  LMP noted for earlier this month. ?-Wet prep ?-Gonorrhea, chlamydia testing ?

## 2022-03-31 ENCOUNTER — Encounter: Payer: Self-pay | Admitting: *Deleted

## 2022-06-07 NOTE — Progress Notes (Deleted)
    SUBJECTIVE:   CHIEF COMPLAINT / HPI:   Vaginal Discharge: Patient is a 26 y.o. female presenting with vaginal discharge for *** days.  She states the discharge is of *** consistency.  She endorses *** vaginal odor.  She is not interested in screening for sexually transmitted infections today.  PERTINENT  PMH / PSH: ***None relevant  OBJECTIVE:   There were no vitals taken for this visit.   General: NAD, pleasant, able to participate in exam Respiratory: Normal effort, no obvious respiratory distress Pelvic: VULVA: normal appearing vulva with no masses, tenderness or lesions, VAGINA: Normal appearing vagina with normal color, no lesions, with {GYN VAGINAL DISCHARGE:21986} discharge present, ***CERVIX: No lesions, {GYN VAGINAL DISCHARGE:21986} discharge present,  Chaperone *** present for pelvic exam  ASSESSMENT/PLAN:   No problem-specific Assessment & Plan notes found for this encounter.    Assessment:  26 y.o. female with vaginal discharge for***days, as well as***.  Physical exam significant for*** discharge.  Wet prep performed today shows *** consistent with ***.  Patient is not interested in STI screening.   Plan: -Wet prep as above.  Will treat with***. -Follow-up as needed  Levin Erp, MD [facility]

## 2022-06-08 ENCOUNTER — Ambulatory Visit: Payer: Medicaid Other | Admitting: Student

## 2022-06-12 IMAGING — CR DG KNEE COMPLETE 4+V*L*
4 series · 4 of 4 positions shown · non-contrast
Comparison: None.

CLINICAL DATA: Left knee pain and swelling for 2 days, no known
injury, initial encounter

EXAM:
LEFT KNEE - COMPLETE 4+ VIEW

[knee ap]
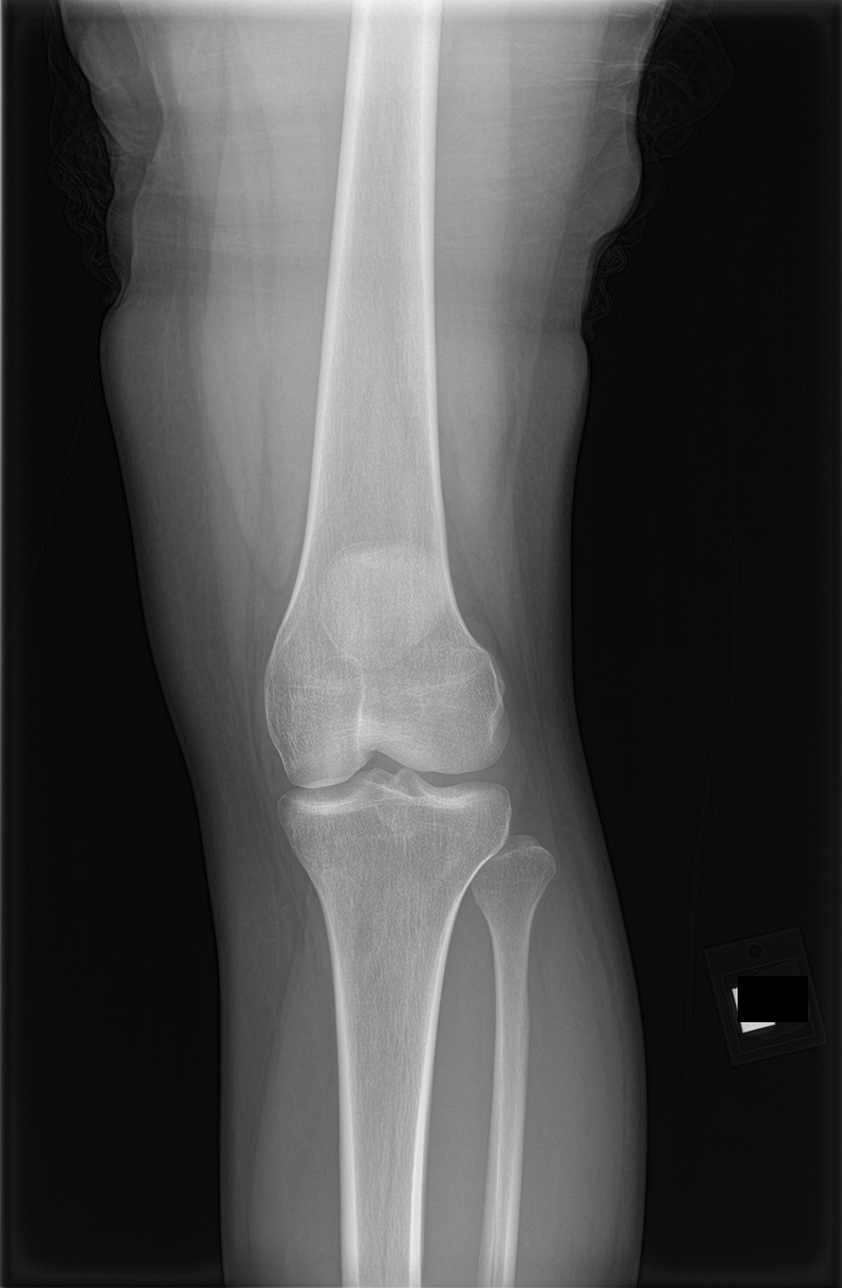

[knee lat]
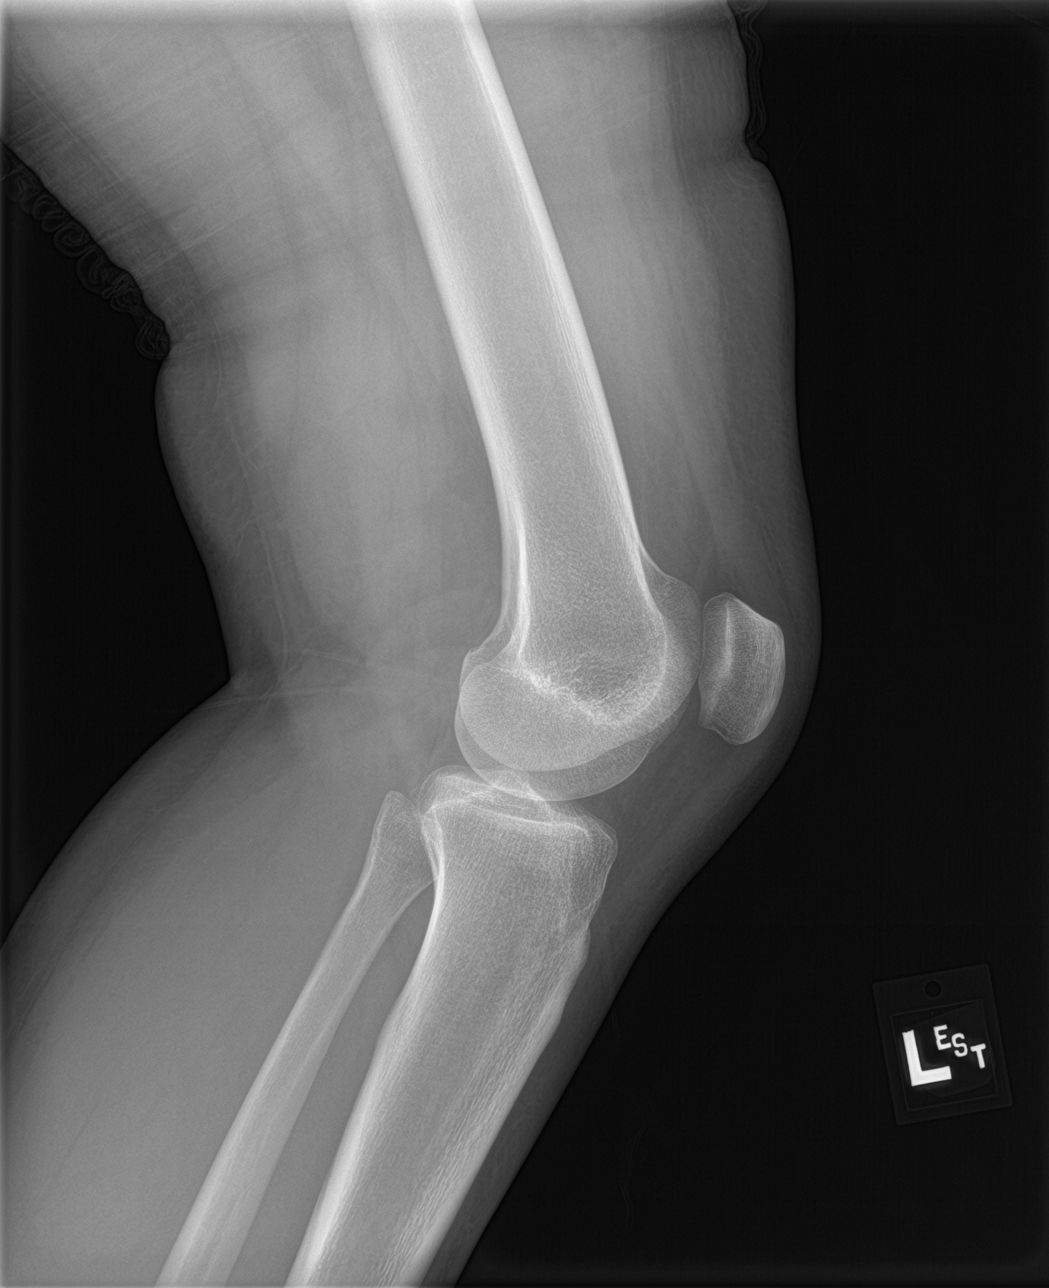

[knee obl (1 of 2)]
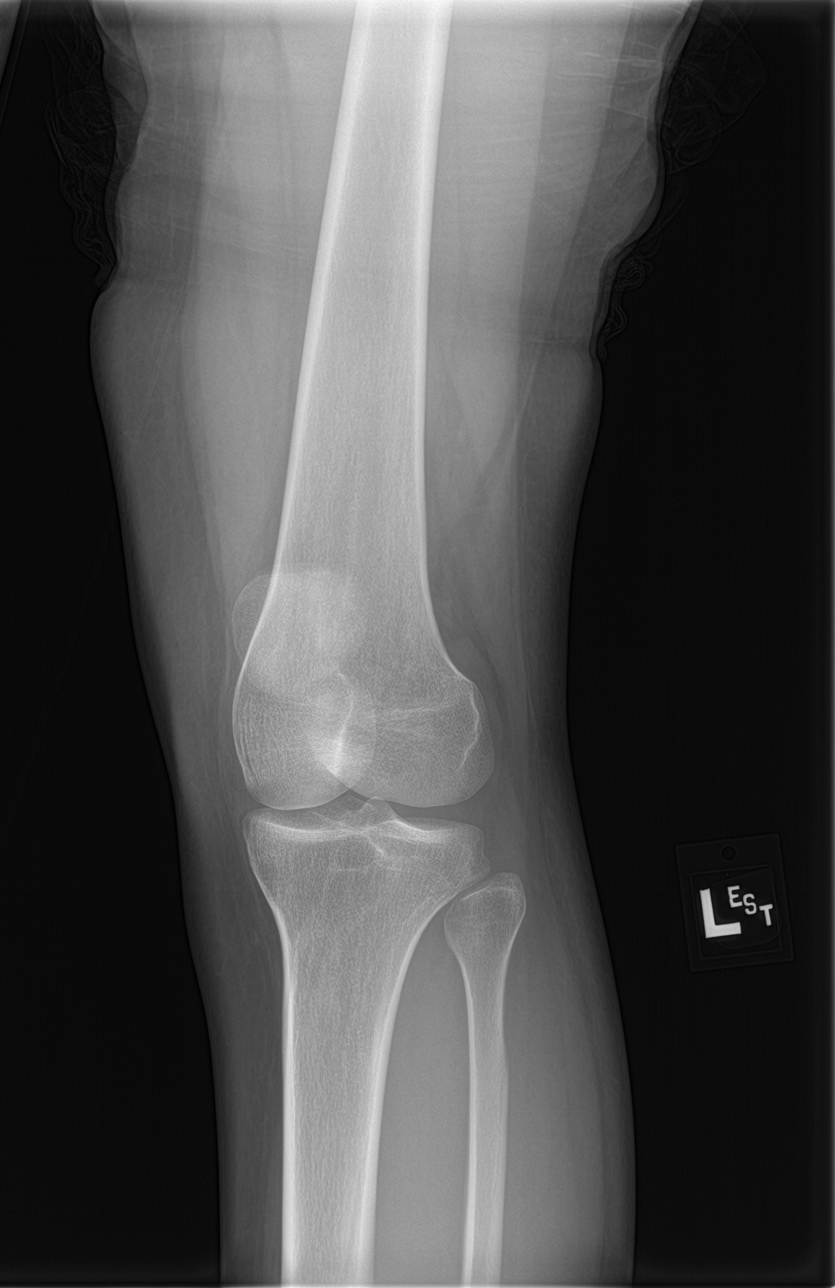

[knee obl (2 of 2)]
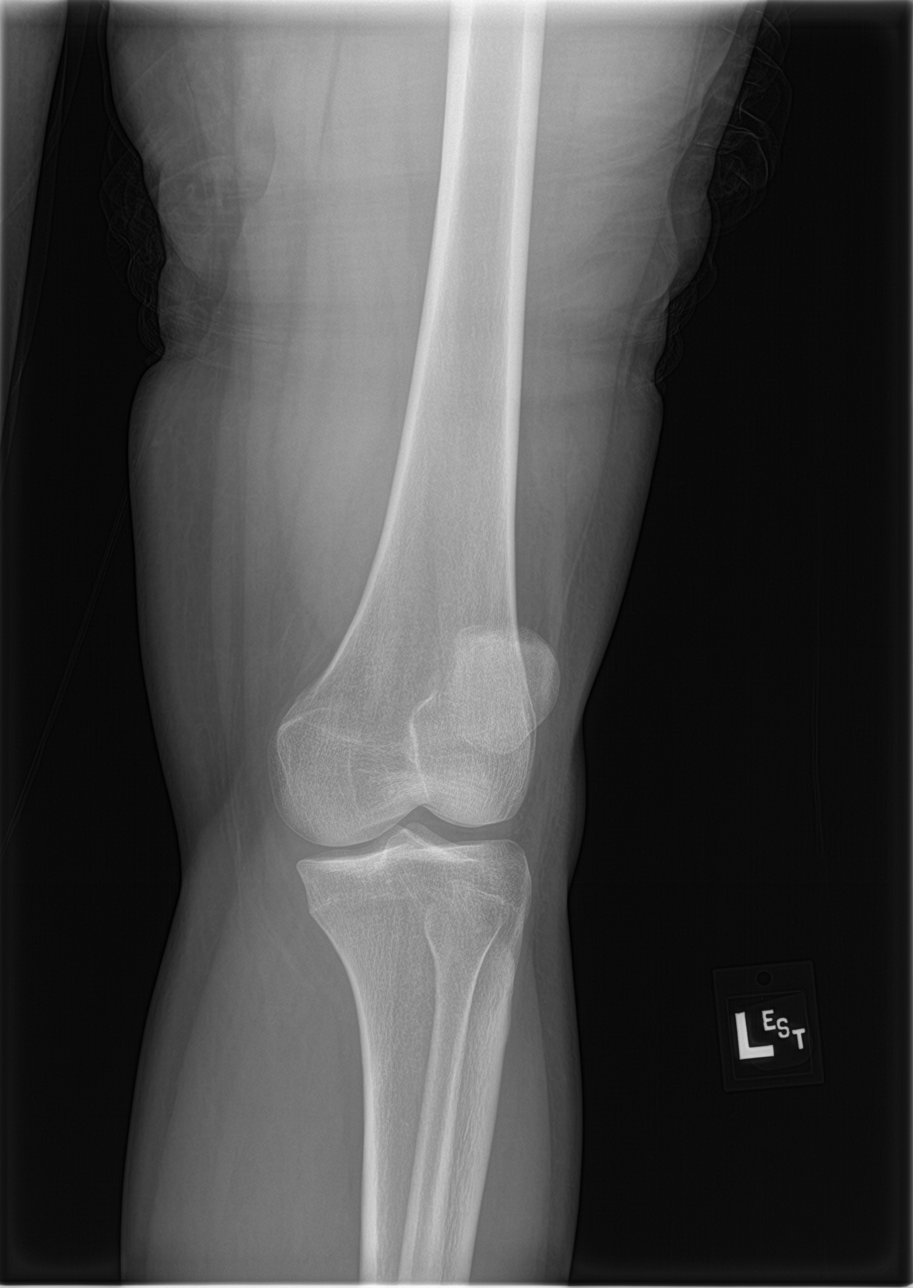

[4 of 4 positions shown; findings below may reference images not displayed]

FINDINGS: No evidence of fracture, dislocation, or joint effusion. No evidence
of arthropathy or other focal bone abnormality. Soft tissues are
unremarkable.
IMPRESSION: No acute abnormality noted.

## 2022-07-10 ENCOUNTER — Ambulatory Visit
Admission: RE | Admit: 2022-07-10 | Discharge: 2022-07-10 | Disposition: A | Discharge status: Home / Self Care | Payer: Medicaid Other | Source: Ambulatory Visit | Attending: Urgent Care | Admitting: Urgent Care

## 2022-07-10 VITALS — BP 126/87 | HR 74 | Temp 98.9°F

## 2022-07-10 DIAGNOSIS — R300 Dysuria: Secondary | ICD-10-CM

## 2022-07-10 DIAGNOSIS — N939 Abnormal uterine and vaginal bleeding, unspecified: Secondary | ICD-10-CM | POA: Insufficient documentation

## 2022-07-10 DIAGNOSIS — N3001 Acute cystitis with hematuria: Secondary | ICD-10-CM | POA: Diagnosis present

## 2022-07-10 LAB — POCT URINALYSIS DIP (MANUAL ENTRY)
Bilirubin, UA: NEGATIVE
Glucose, UA: NEGATIVE mg/dL
Ketones, POC UA: NEGATIVE mg/dL
Nitrite, UA: NEGATIVE
Protein Ur, POC: 100 mg/dL — AB
Spec Grav, UA: 1.02 (ref 1.010–1.025)
Urobilinogen, UA: 1 E.U./dL
pH, UA: 7 (ref 5.0–8.0)

## 2022-07-10 LAB — POCT URINE PREGNANCY: Preg Test, Ur: NEGATIVE

## 2022-07-10 MED ORDER — NITROFURANTOIN MONOHYD MACRO 100 MG PO CAPS: 100.0000 mg | ORAL_CAPSULE | Freq: Two times a day (BID) | ORAL | 0 refills | Status: DC

## 2022-07-10 MED ORDER — PHENAZOPYRIDINE HCL 200 MG PO TABS: 200.0000 mg | ORAL_TABLET | Freq: Three times a day (TID) | ORAL | 0 refills | Status: DC | PRN

## 2022-07-10 NOTE — ED Provider Notes (Signed)
[facility]  Note:  This document was prepared using Conservation officer, historic buildings and may include unintentional dictation errors.  MRN: [id number] DOB: [birth year]  Subjective:   Linda Keith is a 26 y.o. female presenting for 1 day history of acute onset dysuria, urinary urgency, vaginal spotting and hematuria.  Patient just came off of her cycle.  She was also traveling recently and did not hydrate very well for the past couple of weeks.  She did drink more alcohol.  No concern for an STI.  Has the same partner she has had for years.  No fever, nausea, vomiting, abdominal pelvic pain, vaginal discharge.  No current facility-administered medications for this encounter.  Current Outpatient Medications:    benzonatate (TESSALON) 100 MG capsule, Take 1 capsule (100 mg total) by mouth every 8 (eight) hours., Disp: 21 capsule, Rfl: 0   cetirizine (ZYRTEC ALLERGY) 10 MG tablet, Take 1 tablet (10 mg total) by mouth daily., Disp: 30 tablet, Rfl: 0   cyclobenzaprine (FLEXERIL) 5 MG tablet, Take 1 tablet (5 mg total) by mouth at bedtime as needed for muscle spasms., Disp: 10 tablet, Rfl: 0   Ferrous Sulfate (IRON) 325 (65 Fe) MG TABS, Take 1 tablet (325 mg total) by mouth daily., Disp: 30 tablet, Rfl: 0   fluticasone (FLONASE) 50 MCG/ACT nasal spray, Place 2 sprays into both nostrils daily., Disp: 16 g, Rfl: 2   folic acid (FOLVITE) 1 MG tablet, Take 1 tablet (1 mg total) by mouth daily., Disp: 90 tablet, Rfl: 3   hydrocortisone (ANUSOL-HC) 25 MG suppository, Place 1 suppository (25 mg total) rectally 2 (two) times daily., Disp: 24 suppository, Rfl: 3   Hydrocortisone Acetate 1 % OINT, Apply 1 g topically in the morning and at bedtime., Disp: 28.4 g, Rfl: 3   loperamide (IMODIUM) 2 MG capsule, Take 1 capsule (2 mg total) by mouth 2 (two) times daily as needed for diarrhea or loose stools., Disp: 14 capsule, Rfl: 0   meloxicam (MOBIC) 7.5 MG tablet, Take 1 tablet (7.5  mg total) by mouth daily., Disp: 10 tablet, Rfl: 0   metroNIDAZOLE (FLAGYL) 500 MG tablet, Take 1 tablet (500 mg total) by mouth 3 (three) times daily., Disp: 21 tablet, Rfl: 0   Norgestimate-Ethinyl Estradiol Triphasic (ORTHO TRI-CYCLEN LO) 0.18/0.215/0.25 MG-25 MCG tab, Take 1 tablet by mouth daily., Disp: 84 tablet, Rfl: 3   ondansetron (ZOFRAN-ODT) 8 MG disintegrating tablet, Take 1 tablet (8 mg total) by mouth every 8 (eight) hours as needed for nausea or vomiting., Disp: 20 tablet, Rfl: 0   polyethylene glycol powder (GLYCOLAX/MIRALAX) 17 GM/SCOOP powder, Take 17 g by mouth 2 (two) times daily as needed for moderate constipation., Disp: 3350 g, Rfl: 1   Prenatal Vit-Fe Fumarate-FA (PRENATAL 19) tablet, Chew 1 tablet by mouth daily., Disp: 30 tablet, Rfl: 11   promethazine-dextromethorphan (PROMETHAZINE-DM) 6.25-15 MG/5ML syrup, Take 5 mLs by mouth at bedtime as needed for cough., Disp: 100 mL, Rfl: 0   pseudoephedrine (SUDAFED) 60 MG tablet, Take 1 tablet (60 mg total) by mouth every 8 (eight) hours as needed for congestion., Disp: 30 tablet, Rfl: 0   sertraline (ZOLOFT) 50 MG tablet, Take 1 tablet (50 mg total) by mouth daily. (Patient not taking: Reported on 11/20/2019), Disp: 30 tablet, Rfl: 3   No Known Allergies  Past Medical History:  Diagnosis Date   Anemia    Depression    had pp depression which improved without meds   Prenatal care  08/07/2014    Nursing Staff Provider Office Location  [facility] Dating  LMP:05/06/18 Language  English Anatomy US   normal Flu Vaccine  08/02/18 Genetic Screen  Quad: WNL  TDaP vaccine    Hgb A1C or  GTT Early  Third trimester 112 Rhogam  NA   LAB RESULTS  Feeding Plan  Blood Type O/Positive/-- (09/04 1459) O+ Contraception  Antibody Negative (09/04 1459)Negative Circumcision  yes Rubella 4.26 (09/04 1459) 4.26 immune     Past Surgical History:  Procedure Laterality Date   CESAREAN SECTION N/A 11/14/2014   Procedure: CESAREAN SECTION;  Surgeon: Catalina Antigua, MD;  Location: [facility];  Service: Obstetrics;  Laterality: N/A;    Family History  Problem Relation Age of Onset   Cardiomyopathy Mother    Peripheral Artery Disease Mother     Social History   Tobacco Use   Smoking status: Never   Smokeless tobacco: Never  Vaping Use   Vaping Use: Never used  Substance Use Topics   Alcohol use: Yes    Alcohol/week: 2.0 standard drinks of alcohol    Types: 2 Glasses of wine per week   Drug use: No    ROS   Objective:   Vitals: BP 126/87   Pulse 74   Temp 98.9 F (37.2 C)   LMP 07/01/2022 (Approximate)   SpO2 98%   Physical Exam Constitutional:      General: She is not in acute distress.    Appearance: Normal appearance. She is well-developed. She is not ill-appearing, toxic-appearing or diaphoretic.  HENT:     Head: Normocephalic and atraumatic.     Nose: Nose normal.     Mouth/Throat:     Mouth: Mucous membranes are moist.  Eyes:     General: No scleral icterus.       Right eye: No discharge.        Left eye: No discharge.     Extraocular Movements: Extraocular movements intact.     Conjunctiva/sclera: Conjunctivae normal.  Cardiovascular:     Rate and Rhythm: Normal rate.  Pulmonary:     Effort: Pulmonary effort is normal.  Abdominal:     General: Bowel sounds are normal. There is no distension.     Palpations: Abdomen is soft. There is no mass.     Tenderness: There is no abdominal tenderness. There is no right CVA tenderness, left CVA tenderness, guarding or rebound.  Skin:    General: Skin is warm and dry.  Neurological:     General: No focal deficit present.     Mental Status: She is alert and oriented to person, place, and time.  Psychiatric:        Mood and Affect: Mood normal.        Behavior: Behavior normal.        Thought Content: Thought content normal.        Judgment: Judgment normal.     Results for orders placed or performed during the hospital encounter of 07/10/22 (from the past 24  hour(s))  POCT urinalysis dipstick     Status: Abnormal   Collection Time: 07/10/22  4:32 PM  Result Value Ref Range   Color, UA yellow yellow   Clarity, UA turbid (A) clear   Glucose, UA negative negative mg/dL   Bilirubin, UA negative negative   Ketones, POC UA negative negative mg/dL   Spec Grav, UA 9.702 6.378 - 1.025   Blood, UA large (A) negative   pH, UA 7.0 5.0 -  8.0   Protein Ur, POC =100 (A) negative mg/dL   Urobilinogen, UA 1.0 0.2 or 1.0 E.U./dL   Nitrite, UA Negative Negative   Leukocytes, UA Small (1+) (A) Negative  POCT urine pregnancy     Status: None   Collection Time: 07/10/22  4:32 PM  Result Value Ref Range   Preg Test, Ur Negative Negative    Assessment and Plan :   PDMP not reviewed this encounter.  1. Acute cystitis with hematuria   2. Dysuria   3. Vaginal spotting    Patient declined STI testing.  Start Macrobid to cover for acute cystitis, urine culture pending.  Recommended aggressive hydration, limiting urinary irritants. Counseled patient on potential for adverse effects with medications prescribed/recommended today, ER and return-to-clinic precautions discussed, patient verbalized understanding.    Wallis Bamberg, PA-C 07/10/22 1640

## 2022-07-10 NOTE — Discharge Instructions (Addendum)
Please start Macrobid to address an urinary tract infection. Make sure you hydrate very well with plain water and a quantity of 64 ounces of water a day.  Please limit drinks that are considered urinary irritants such as soda, sweet tea, coffee, energy drinks, alcohol.  These can worsen your urinary and genital symptoms but also be the source of them.  I will let you know about your urine culture and vaginal swab results through MyChart to see if we need to prescribe or change your antibiotics based off of those results.  

## 2022-07-10 NOTE — ED Triage Notes (Signed)
Pt here with vaginal spotting and painful pressure when urinating since yesterday.

## 2022-07-11 LAB — URINE CULTURE: Culture: <10000 — AB

## 2022-07-13 ENCOUNTER — Telehealth (HOSPITAL_COMMUNITY): Payer: Self-pay | Admitting: Emergency Medicine

## 2022-07-13 LAB — CERVICOVAGINAL ANCILLARY ONLY
Bacterial Vaginitis (gardnerella): POSITIVE — AB
Candida Glabrata: NEGATIVE
Candida Vaginitis: NEGATIVE
Comment: NEGATIVE
Comment: NEGATIVE
Comment: NEGATIVE

## 2022-07-13 MED ORDER — METRONIDAZOLE 500 MG PO TABS: 500.0000 mg | ORAL_TABLET | Freq: Two times a day (BID) | ORAL | 0 refills | Status: DC

## 2022-07-14 ENCOUNTER — Ambulatory Visit: Payer: Medicaid Other | Admitting: Student

## 2022-07-14 NOTE — Progress Notes (Deleted)
    SUBJECTIVE:   CHIEF COMPLAINT / HPI:   Vaginal Discharge: Patient is a 26 y.o. female presenting with vaginal discharge for *** days.  She states the discharge is of *** consistency.  She endorses *** vaginal odor.  She is not *** interested in screening for sexually transmitted infections today. She has contraception with *** {Contraceptives:21111124}. She {DOES NOT does:27190::"does not"} use barrier method consistently.  PERTINENT  PMH / PSH: ***None relevant  OBJECTIVE:   LMP 07/01/2022 (Approximate)    General: NAD, pleasant, able to participate in exam Respiratory: Normal effort, no obvious respiratory distress Pelvic: VULVA: normal appearing vulva with no masses, tenderness or lesions, VAGINA: Normal appearing vagina with normal color, no lesions, with {GYN VAGINAL DISCHARGE:21986} discharge present, ***CERVIX: No lesions, {GYN VAGINAL DISCHARGE:21986} discharge present  Chaperone *** CMA present for pelvic exam  ASSESSMENT/PLAN:   No problem-specific Assessment & Plan notes found for this encounter.   Assessment:  26 y.o. female with vaginal discharge for***days, as well as***.  Physical exam significant for*** discharge.  Wet prep performed today shows *** consistent with ***.  Patient is not interested in STI screening.   Plan: -Wet prep as above.  Will treat with***. -Discussed protection during intercourse and contraceptive methods*** -Follow-up as needed  Gerrit Heck, MD [place]

## 2023-02-05 ENCOUNTER — Other Ambulatory Visit (HOSPITAL_COMMUNITY)
Admission: RE | Admit: 2023-02-05 | Discharge: 2023-02-05 | Disposition: A | Discharge status: Home / Self Care | Payer: Medicaid Other | Source: Ambulatory Visit | Attending: Family Medicine | Admitting: Family Medicine

## 2023-02-05 ENCOUNTER — Ambulatory Visit (INDEPENDENT_AMBULATORY_CARE_PROVIDER_SITE_OTHER): Payer: Medicaid Other | Admitting: Family Medicine

## 2023-02-05 ENCOUNTER — Encounter: Payer: Self-pay | Admitting: Family Medicine

## 2023-02-05 VITALS — BP 112/83 | HR 72 | Ht 63.0 in | Wt 149.0 lb

## 2023-02-05 DIAGNOSIS — Z113 Encounter for screening for infections with a predominantly sexual mode of transmission: Secondary | ICD-10-CM | POA: Insufficient documentation

## 2023-02-05 NOTE — Patient Instructions (Addendum)
It was great seeing you today!  Today we did STI testing. I will call you if anything is abnormal or will send a mychart message if normal.   Feel free to call with any questions or concerns at any time, at [phone].   Take care,  Dr. Cora Collum [facility]

## 2023-02-05 NOTE — Assessment & Plan Note (Addendum)
Has unprotected intercourse with 1 partner. No vaginal or urinary symptoms. Not on contraception, declines at this time. Does not desire pregnancy. Discussed condom use.  -GC/chlamydia pending -Will check HIV and RPR

## 2023-02-05 NOTE — Progress Notes (Signed)
    SUBJECTIVE:   CHIEF COMPLAINT / HPI:   Vaginal Discharge: Patient is a 27 y.o. female presenting for STI check. Denies any vaginal or urinary symptoms. No new partners. Not currently on contraception and does not desire at this time. Also doesn't desire pregnancy. LMP 3/18.    OBJECTIVE:   BP 112/83   Pulse 72   Ht 5\' 3"  (1.6 m)   Wt 149 lb (67.6 kg)   LMP 01/11/2023   SpO2 100%   BMI 26.39 kg/m    General: NAD, pleasant, able to participate in exam Respiratory: Normal effort, no obvious respiratory distress Pelvic: VULVA: normal appearing vulva with no masses, tenderness or lesions, VAGINA: Normal appearing vagina with normal color, no lesions, with scant and clear discharge present, CERVIX: No lesions.  Chaperone CMA Teshira Leggette present for pelvic exam  ASSESSMENT/PLAN:   Screening examination for STI Has unprotected intercourse with 1 partner. No vaginal or urinary symptoms. Not on contraception, declines at this time. Does not desire pregnancy. Discussed condom use.  -GC/chlamydia pending -Will check HIV and RPR    Cora Collum, DO [facility]

## 2023-02-06 LAB — HIV ANTIBODY (ROUTINE TESTING W REFLEX): HIV Screen 4th Generation wRfx: NONREACTIVE

## 2023-02-06 LAB — RPR: RPR Ser Ql: NONREACTIVE

## 2023-02-09 ENCOUNTER — Other Ambulatory Visit: Payer: Self-pay | Admitting: Family Medicine

## 2023-02-09 LAB — CERVICOVAGINAL ANCILLARY ONLY
Bacterial Vaginitis (gardnerella): POSITIVE — AB
Candida Glabrata: NEGATIVE
Candida Vaginitis: NEGATIVE
Chlamydia: NEGATIVE
Comment: NEGATIVE
Comment: NEGATIVE
Comment: NEGATIVE
Comment: NEGATIVE
Comment: NEGATIVE
Comment: NORMAL
Neisseria Gonorrhea: NEGATIVE
Trichomonas: NEGATIVE

## 2023-02-09 MED ORDER — METRONIDAZOLE 500 MG PO TABS: 500.0000 mg | ORAL_TABLET | Freq: Two times a day (BID) | ORAL | 0 refills | Status: DC

## 2023-03-24 ENCOUNTER — Encounter: Payer: Self-pay | Admitting: Family Medicine

## 2023-03-24 ENCOUNTER — Ambulatory Visit: Payer: Medicaid Other | Admitting: Family Medicine

## 2023-06-21 ENCOUNTER — Ambulatory Visit: Payer: Medicaid Other | Admitting: Student

## 2023-06-21 NOTE — Progress Notes (Deleted)
  SUBJECTIVE:   CHIEF COMPLAINT / HPI:   STI check - Treated for BV 04/24 - preferred gender of partner: *** - Medications tried: *** - Sexually active with *** *** partner(s) - Last sexual encounter: *** - Contraception: *** Symptoms include: {STISXs:28021}   PERTINENT  PMH / PSH: ***    Patient Care Team: Lockie Mola, MD as PCP - General (Family Medicine) OBJECTIVE:  There were no vitals taken for this visit. Physical Exam   ASSESSMENT/PLAN:  There are no diagnoses linked to this encounter. No follow-ups on file. Linda Martinez, MD 06/21/2023, 8:12 AM PGY-3, [facility] {    This will disappear when note is signed, click to select method of visit    :1}

## 2023-09-20 ENCOUNTER — Ambulatory Visit: Payer: Medicaid Other | Admitting: Family Medicine

## 2023-10-01 ENCOUNTER — Encounter: Payer: Medicaid Other | Admitting: Family Medicine
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
## 2023-10-06 ENCOUNTER — Encounter: Payer: Medicaid Other | Admitting: Student

## 2023-10-12 ENCOUNTER — Encounter: Payer: Medicaid Other | Admitting: Family Medicine

## 2023-10-15 ENCOUNTER — Encounter: Payer: Medicaid Other | Admitting: Family Medicine

## 2023-10-22 ENCOUNTER — Encounter: Payer: Medicaid Other | Admitting: Family Medicine

## 2023-11-11 ENCOUNTER — Ambulatory Visit: Payer: Medicaid Other | Admitting: Family Medicine

## 2023-12-02 ENCOUNTER — Encounter: Payer: Medicaid Other | Admitting: Family Medicine

## 2023-12-20 ENCOUNTER — Encounter: Payer: Medicaid Other | Admitting: Family Medicine

## 2024-01-11 ENCOUNTER — Encounter: Admitting: Family Medicine

## 2024-03-31 ENCOUNTER — Encounter: Admitting: Family Medicine
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
## 2024-06-01 ENCOUNTER — Ambulatory Visit: Admitting: Family Medicine

## 2024-06-01 ENCOUNTER — Other Ambulatory Visit (HOSPITAL_COMMUNITY)
Admission: RE | Admit: 2024-06-01 | Discharge: 2024-06-01 | Disposition: A | Discharge status: Home / Self Care | Source: Ambulatory Visit | Attending: Family Medicine | Admitting: Family Medicine

## 2024-06-01 ENCOUNTER — Encounter: Payer: Self-pay | Admitting: Family Medicine

## 2024-06-01 VITALS — BP 106/83 | HR 84 | Ht 63.5 in | Wt 152.2 lb

## 2024-06-01 DIAGNOSIS — Z113 Encounter for screening for infections with a predominantly sexual mode of transmission: Secondary | ICD-10-CM

## 2024-06-01 DIAGNOSIS — Z124 Encounter for screening for malignant neoplasm of cervix: Secondary | ICD-10-CM

## 2024-06-01 DIAGNOSIS — Z23 Encounter for immunization: Secondary | ICD-10-CM | POA: Diagnosis present

## 2024-06-01 NOTE — Patient Instructions (Signed)
 It was wonderful to see you today.  Please bring ALL of your medications with you to every visit.   Today we talked about:  Physical - You look great! We discussed contraception and have scheduled you for a nexplanon  placement on 06/06/2024. Please refrain from sexual intercourse until then or reliably use condoms to prevent pregnancy. If you do not start your period before the appointment please reschedule the appointment for a couple days later.   Thank you for choosing [facility].   Please call [phone] with any questions about today's appointment.   Areta Saliva, MD  Family Medicine
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
## 2024-06-02 ENCOUNTER — Ambulatory Visit: Payer: Self-pay | Admitting: Family Medicine

## 2024-06-02 DIAGNOSIS — A749 Chlamydial infection, unspecified: Secondary | ICD-10-CM

## 2024-06-02 LAB — SYPHILIS: RPR W/REFLEX TO RPR TITER AND TREPONEMAL ANTIBODIES, TRADITIONAL SCREENING AND DIAGNOSIS ALGORITHM: RPR Ser Ql: NONREACTIVE

## 2024-06-02 LAB — HIV ANTIBODY (ROUTINE TESTING W REFLEX): HIV Screen 4th Generation wRfx: NONREACTIVE

## 2024-06-05 LAB — CYTOLOGY - PAP
Chlamydia: POSITIVE — AB
Comment: NEGATIVE
Comment: NEGATIVE
Comment: NORMAL
Diagnosis: NEGATIVE
Neisseria Gonorrhea: NEGATIVE
Trichomonas: NEGATIVE

## 2024-06-06 ENCOUNTER — Ambulatory Visit: Payer: Self-pay | Admitting: Family Medicine

## 2024-06-06 MED ORDER — DOXYCYCLINE HYCLATE 100 MG PO TABS: 100.0000 mg | ORAL_TABLET | Freq: Two times a day (BID) | ORAL | 0 refills | Status: DC

## 2024-06-16 ENCOUNTER — Ambulatory Visit: Admitting: Family Medicine

## 2024-07-18 ENCOUNTER — Ambulatory Visit
Admission: EM | Admit: 2024-07-18 | Discharge: 2024-07-18 | Disposition: A | Discharge status: Home / Self Care | Attending: Family Medicine | Admitting: Family Medicine

## 2024-07-18 ENCOUNTER — Ambulatory Visit (INDEPENDENT_AMBULATORY_CARE_PROVIDER_SITE_OTHER)

## 2024-07-18 DIAGNOSIS — H5712 Ocular pain, left eye: Secondary | ICD-10-CM

## 2024-07-18 DIAGNOSIS — J3489 Other specified disorders of nose and nasal sinuses: Secondary | ICD-10-CM

## 2024-07-18 MED ORDER — IBUPROFEN 600 MG PO TABS: 600.0000 mg | ORAL_TABLET | Freq: Three times a day (TID) | ORAL | 0 refills | Status: AC | PRN

## 2024-07-18 MED ORDER — MUPIROCIN 2 % EX OINT: 1.0000 | TOPICAL_OINTMENT | Freq: Two times a day (BID) | CUTANEOUS | 0 refills | Status: AC

## 2024-07-18 NOTE — ED Triage Notes (Signed)
 Patient reports going up her sister's steps (wooden ones) with croc's on and slipped/stumbled as I was going up, I hit the ground on the top of the steps hitting left side of head/face/nose. Today my face/nose hurts. No LOC known. Some abrasions on face. Tdap UTD per patient.

## 2024-07-18 NOTE — ED Provider Notes (Signed)
 [facility]    CSN: [id number] Arrival date & time: 07/18/24  1524      History   Chief Complaint Chief Complaint  Patient presents with   Fall    HPI Linda Keith is a 28 y.o. female.    Fall  Here for pain in her left cheek and nose and forehead.  Yesterday evening she slipped on some steps and fell onto her left face and cheek.  No loss of consciousness  No double vision.  No blurry vision.  No vomiting  NKDA  Last menstrual cycle began September 10  Last Tdap was in January 2021  Past Medical History:  Diagnosis Date   Acute pain of left knee 01/08/2014   Anemia    Depression    had pp depression which improved without meds   Hemorrhoids 06/17/2020   Nerve pain 05/08/2020   Nerve pain in right hand with palpation to Nexplanon .  This previously occurred with patient's previous Nexplanon  which needed to be moved in position.     Placental abruption 12/15/2019   Prenatal care 08/07/2014    Nursing Staff Provider Office Location  [facility] Dating  LMP:05/06/18 Language  English Anatomy US    normal Flu Vaccine  08/02/18 Genetic Screen  Quad: WNL  TDaP vaccine    Hgb A1C or  GTT Early  Third trimester 112 Rhogam  NA   LAB RESULTS  Feeding Plan  Blood Type O/Positive/-- (09/04 1459) O+ Contraception  Antibody Negative (09/04 1459)Negative Circumcision  yes Rubella 4.26 (09/04 1459) 4.26 immune   Previous cesarean delivery affecting pregnancy, antepartum 12/12/2018   Arrest of Descent 2016     Screening examination for STI 02/05/2023   Short interval between pregnancies affecting pregnancy in third trimester, antepartum 11/30/2019   VBAC (vaginal birth after Cesarean) 01/22/2019    Patient Active Problem List   Diagnosis Date Noted   Grade II hemorrhoids 08/07/2020   Irregular uterine bleeding 05/08/2020    Past Surgical History:  Procedure Laterality Date   CESAREAN SECTION N/A 11/14/2014   Procedure: CESAREAN SECTION;  Surgeon: Winton Felt, MD;   Location: [facility];  Service: Obstetrics;  Laterality: N/A;    OB History     Gravida  3   Para  3   Term  3   Preterm      AB      Living  3      SAB      IAB      Ectopic      Multiple  0   Live Births  3            Home Medications    Prior to Admission medications   Medication Sig Start Date End Date Taking? Authorizing Provider  ibuprofen  (ADVIL ) 600 MG tablet Take 1 tablet (600 mg total) by mouth every 8 (eight) hours as needed (pain). 07/18/24  Yes Grant Swager K, MD  mupirocin  ointment (BACTROBAN ) 2 % Apply 1 Application topically 2 (two) times daily. To affected area till better 07/18/24  Yes Jaselyn Nahm, Sharlet POUR, MD  cetirizine  (ZYRTEC  ALLERGY) 10 MG tablet Take 1 tablet (10 mg total) by mouth daily. 02/17/21   Hope Merle, MD  Ferrous Sulfate  (IRON ) 325 (65 Fe) MG TABS Take 1 tablet (325 mg total) by mouth daily. 08/07/20   Lenon Chiquita BROCKS, DO  fluticasone  (FLONASE ) 50 MCG/ACT nasal spray Place 2 sprays into both nostrils daily. 02/17/21   Hope Merle, MD  folic acid  (FOLVITE )  1 MG tablet Take 1 tablet (1 mg total) by mouth daily. 02/17/21   Hope Merle, MD  hydrocortisone  (ANUSOL -HC) 25 MG suppository Place 1 suppository (25 mg total) rectally 2 (two) times daily. 08/07/20   Lenon Chiquita BROCKS, DO  Hydrocortisone  Acetate 1 % OINT Apply 1 g topically in the morning and at bedtime. 08/07/20   Lenon Chiquita BROCKS, DO  polyethylene glycol powder (GLYCOLAX /MIRALAX ) 17 GM/SCOOP powder Take 17 g by mouth 2 (two) times daily as needed for moderate constipation. 11/30/19   Delores Suzann HERO, MD  Prenatal Vit-Fe Fumarate-FA (PRENATAL 19) tablet Chew 1 tablet by mouth daily. 05/04/19   Sung Hollering, CNM    Family History Family History  Problem Relation Age of Onset   Cardiomyopathy Mother    Peripheral Artery Disease Mother     Social History Social History   Tobacco Use   Smoking status: Never    Passive exposure: Never   Smokeless tobacco: Never   Vaping Use   Vaping status: Never Used  Substance Use Topics   Alcohol use: Yes    Alcohol/week: 2.0 standard drinks of alcohol    Types: 2 Glasses of wine per week    Comment: Occassionally   Drug use: No     Allergies   Patient has no known allergies.   Review of Systems Review of Systems   Physical Exam Triage Vital Signs ED Triage Vitals  Encounter Vitals Group     BP 07/18/24 1615 115/79     Girls Systolic BP Percentile --      Girls Diastolic BP Percentile --      Boys Systolic BP Percentile --      Boys Diastolic BP Percentile --      Pulse Rate 07/18/24 1615 79     Resp 07/18/24 1615 18     Temp 07/18/24 1615 99.1 F (37.3 C)     Temp Source 07/18/24 1615 Oral     SpO2 07/18/24 1615 99 %     Weight 07/18/24 1612 150 lb (68 kg)     Height 07/18/24 1612 5' 3 (1.6 m)     Head Circumference --      Peak Flow --      Pain Score 07/18/24 1610 9     Pain Loc --      Pain Education --      Exclude from Growth Chart --    No data found.  Updated Vital Signs BP 115/79 (BP Location: Left Arm)   Pulse 79   Temp 99.1 F (37.3 C) (Oral)   Resp 18   Ht 5' 3 (1.6 m)   Wt 68 kg   LMP 07/05/2024 (Exact Date)   SpO2 99%   BMI 26.57 kg/m   Visual Acuity Right Eye Distance:   Left Eye Distance:   Bilateral Distance:    Right Eye Near:   Left Eye Near:    Bilateral Near:     Physical Exam Vitals reviewed.  Constitutional:      General: She is not in acute distress.    Appearance: She is not ill-appearing, toxic-appearing or diaphoretic.  HENT:     Nose:     Comments: There is some tenderness of the bridge of her nose.  The left burtis is a little swollen denies abrasion anywhere.  It is not bleeding.  Inside her left nostril there is some swelling of the lateral aspect of the nasal cavity.  It is not obstructive  Mouth/Throat:     Mouth: Mucous membranes are moist.  Eyes:     Extraocular Movements: Extraocular movements intact.      Conjunctiva/sclera: Conjunctivae normal.     Pupils: Pupils are equal, round, and reactive to light.     Comments: There is some tenderness of the left cheek, but no step-off.  Cardiovascular:     Rate and Rhythm: Normal rate and regular rhythm.     Heart sounds: No murmur heard. Pulmonary:     Effort: Pulmonary effort is normal.     Breath sounds: Normal breath sounds.  Musculoskeletal:     Cervical back: Neck supple.  Lymphadenopathy:     Cervical: No cervical adenopathy.  Skin:    Coloration: Skin is not jaundiced or pale.  Neurological:     General: No focal deficit present.     Mental Status: She is alert and oriented to person, place, and time.  Psychiatric:        Behavior: Behavior normal.      UC Treatments / Results  Labs (all labs ordered are listed, but only abnormal results are displayed) Labs Reviewed - No data to display  EKG   Radiology DG Orbits Result Date: 07/18/2024 CLINICAL DATA:  Pain in the nose and left cheek after slip and fall injury, striking the left side of the face. EXAM: ORBITS - COMPLETE 4+ VIEW; NASAL BONES - 3+ VIEW COMPARISON:  None Available. FINDINGS: Three views of the orbits and three views of the nasal bones are obtained. Visualized orbital, facial bones, and nasal bones are intact. No evidence of acute fracture or dislocation. Nasal septum is midline. Visualized paranasal sinuses are clear. IMPRESSION: No acute bony abnormalities. Electronically Signed   By: Elsie Gravely M.D.   On: 07/18/2024 17:25   DG Nasal Bones Result Date: 07/18/2024 CLINICAL DATA:  Pain in the nose and left cheek after slip and fall injury, striking the left side of the face. EXAM: ORBITS - COMPLETE 4+ VIEW; NASAL BONES - 3+ VIEW COMPARISON:  None Available. FINDINGS: Three views of the orbits and three views of the nasal bones are obtained. Visualized orbital, facial bones, and nasal bones are intact. No evidence of acute fracture or dislocation. Nasal septum is  midline. Visualized paranasal sinuses are clear. IMPRESSION: No acute bony abnormalities. Electronically Signed   By: Elsie Gravely M.D.   On: 07/18/2024 17:25    Procedures Procedures (including critical care time)  Medications Ordered in UC Medications - No data to display  Initial Impression / Assessment and Plan / UC Course  I have reviewed the triage vital signs and the nursing notes.  Pertinent labs & imaging results that were available during my care of the patient were reviewed by me and considered in my medical decision making (see chart for details).     X-rays are all read as negative.  Recommend to send into apply inside her nose and on the external abrasion.  Ibuprofen  800 mg for the pain.  I recommended ice Final Clinical Impressions(s) / UC Diagnoses   Final diagnoses:  Nasal pain  Pain of left orbit     Discharge Instructions      X-rays are all read as negative for fractures.  Take ibuprofen  600 mg--1 tab every 8 hours as needed for pain.  Put mupirocin  ointment on the sore areas and inside your left nostril twice daily until improved  Icing the sore areas could help       ED Prescriptions  Medication Sig Dispense Auth. Provider   mupirocin  ointment (BACTROBAN ) 2 % Apply 1 Application topically 2 (two) times daily. To affected area till better 22 g Vonna Sharlet POUR, MD   ibuprofen  (ADVIL ) 600 MG tablet Take 1 tablet (600 mg total) by mouth every 8 (eight) hours as needed (pain). 15 tablet Andreyah Natividad K, MD      PDMP not reviewed this encounter.   Vonna Sharlet POUR, MD 07/18/24 [phone]

## 2024-07-18 NOTE — Discharge Instructions (Signed)
 X-rays are all read as negative for fractures.  Take ibuprofen  600 mg--1 tab every 8 hours as needed for pain.  Put mupirocin  ointment on the sore areas and inside your left nostril twice daily until improved  Icing the sore areas could help

## 2024-10-06 ENCOUNTER — Inpatient Hospital Stay: Admission: RE | Admit: 2024-10-06 | Source: Ambulatory Visit

## 2024-11-07 ENCOUNTER — Ambulatory Visit: Payer: Self-pay | Admitting: Family Medicine
